# Patient Record
Sex: Male | Born: 1945 | Race: White | Hispanic: No | Marital: Married | Smoking: Never smoker
Health system: Southern US, Community
[De-identification: ages and names within clinical notes are randomized; demographics above are authoritative.]

## PROBLEM LIST (undated history)

## (undated) DIAGNOSIS — N200 Calculus of kidney: Secondary | ICD-10-CM

## (undated) DIAGNOSIS — I10 Essential (primary) hypertension: Secondary | ICD-10-CM

## (undated) DIAGNOSIS — E039 Hypothyroidism, unspecified: Secondary | ICD-10-CM

## (undated) HISTORY — PX: VASECTOMY: SHX75

---

## 2020-01-06 DIAGNOSIS — G250 Essential tremor: Secondary | ICD-10-CM | POA: Diagnosis present

## 2020-08-10 ENCOUNTER — Other Ambulatory Visit: Payer: Self-pay | Admitting: Internal Medicine

## 2020-08-10 ENCOUNTER — Other Ambulatory Visit (HOSPITAL_COMMUNITY): Payer: Self-pay | Admitting: Internal Medicine

## 2020-08-10 DIAGNOSIS — M5441 Lumbago with sciatica, right side: Secondary | ICD-10-CM

## 2020-08-10 DIAGNOSIS — M5442 Lumbago with sciatica, left side: Secondary | ICD-10-CM

## 2020-08-26 ENCOUNTER — Ambulatory Visit
Admission: RE | Admit: 2020-08-26 | Discharge: 2020-08-26 | Disposition: A | Discharge status: Home / Self Care | Payer: Medicare Other | Source: Ambulatory Visit | Attending: Internal Medicine | Admitting: Internal Medicine

## 2020-08-26 ENCOUNTER — Other Ambulatory Visit: Payer: Self-pay

## 2020-08-26 DIAGNOSIS — M5442 Lumbago with sciatica, left side: Secondary | ICD-10-CM | POA: Diagnosis not present

## 2020-08-26 DIAGNOSIS — M5441 Lumbago with sciatica, right side: Secondary | ICD-10-CM | POA: Insufficient documentation

## 2020-08-26 IMAGING — MR MR LUMBAR SPINE W/O CM
4 of 5 series · 26 of 48 positions shown · non-contrast
Comparison: None.

CLINICAL DATA: Low back pain.  Bilateral buttock and leg pain.

EXAM:
MRI LUMBAR SPINE WITHOUT CONTRAST
TECHNIQUE: Multiplanar, multisequence MR imaging of the lumbar spine was
performed. No intravenous contrast was administered.

[Series 2: T2 · sagittal · 4.0mm · 0.81mm/px · 6 of 15 slices shown (1 of 2)]
[im 1/15]
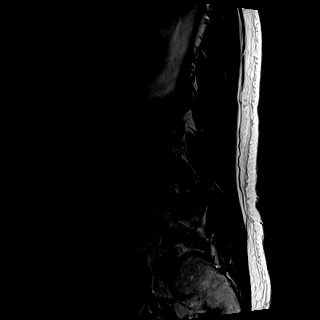
[im 3/15]
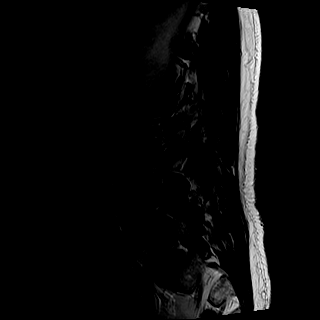
[im 6/15]
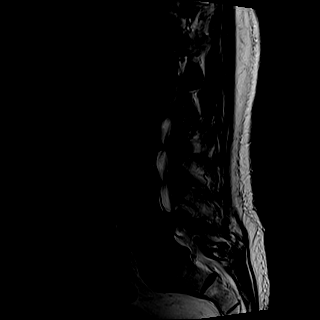
[im 9/15]
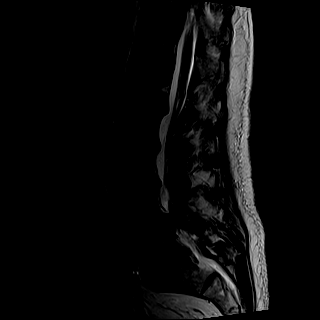
[im 12/15]
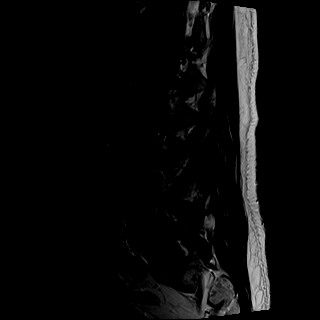
[im 15/15]
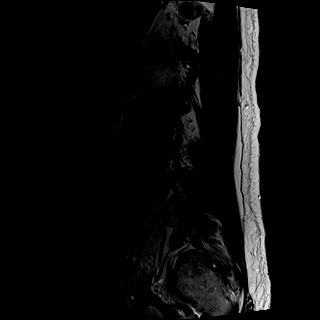

[Series 3: T1 · sagittal · 4.0mm · 0.41mm/px · 6 of 15 slices shown (1 of 2)]
[im 1/15]
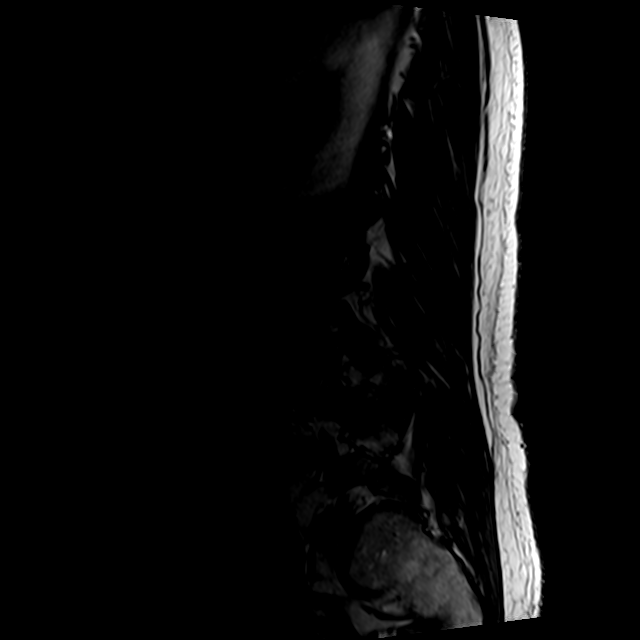
[im 3/15]
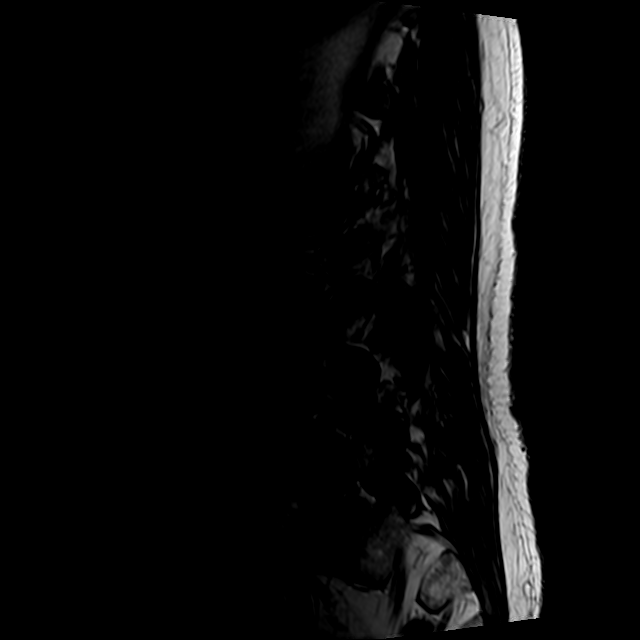
[im 6/15]
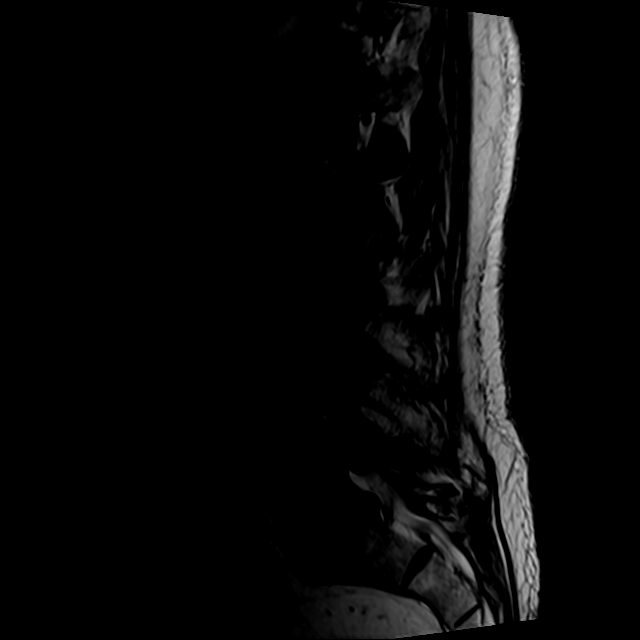
[im 9/15]
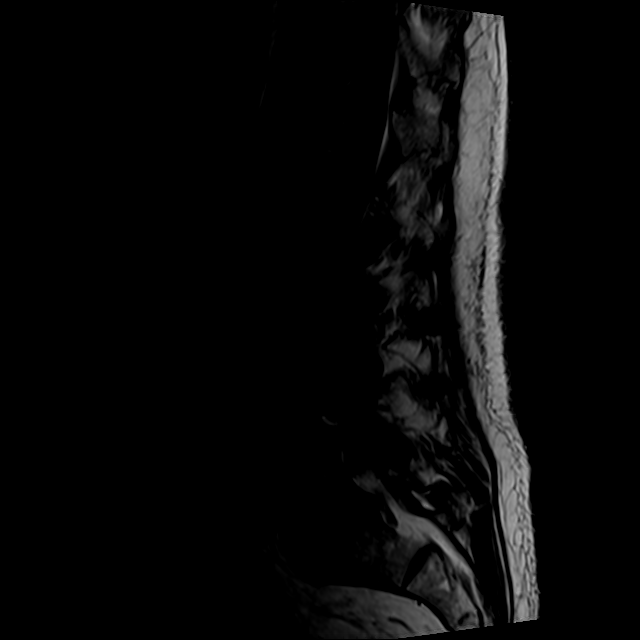
[im 12/15]
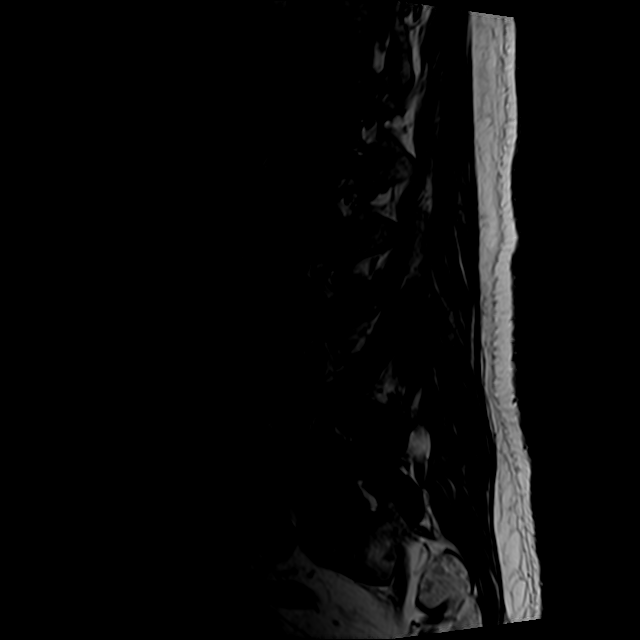
[im 15/15]
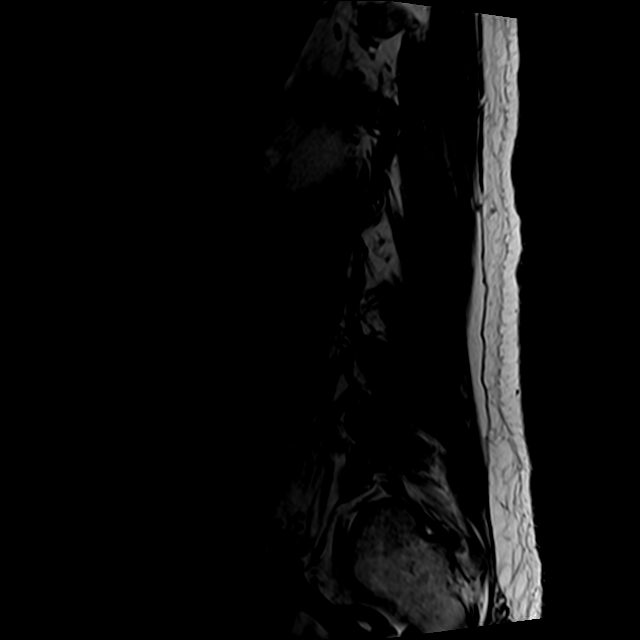

[Series 5: T2 · axial · 4.0mm · 0.78mm/px · z∈[-139,+88]mm · 9 of 39 slices shown (2 of 2)]
[im 1/39]
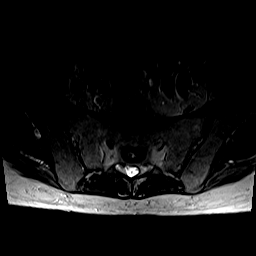
[im 6/39]
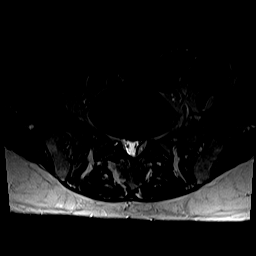
[im 11/39]
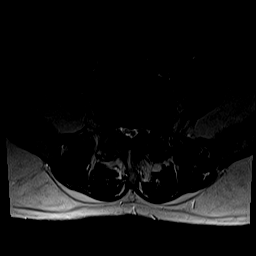
[im 17/39]
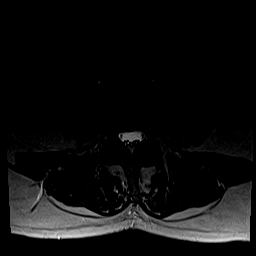
[im 20/39]
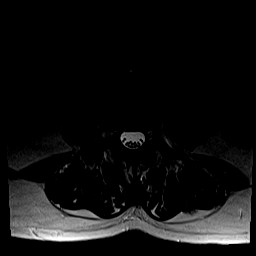
[im 22/39]
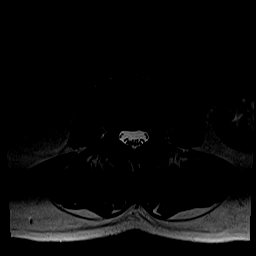
[im 28/39]
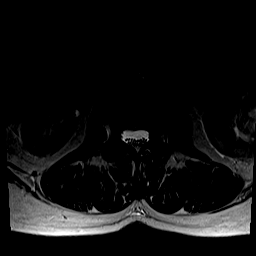
[im 33/39]
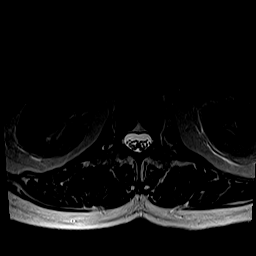
[im 39/39]
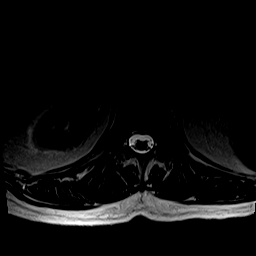

[Series 6: T1 · axial · 4.0mm · 0.39mm/px · z∈[-139,+58]mm · 5 of 39 slices shown (2 of 2)]
[im 1/39]
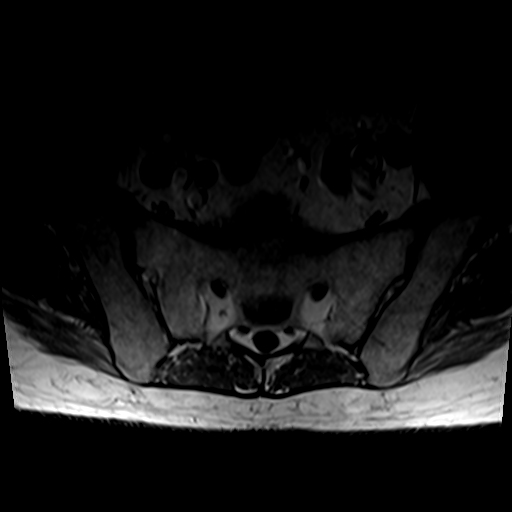
[im 6/39]
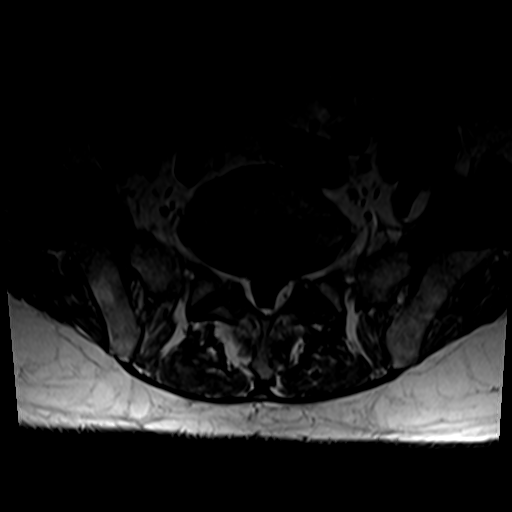
[im 11/39]
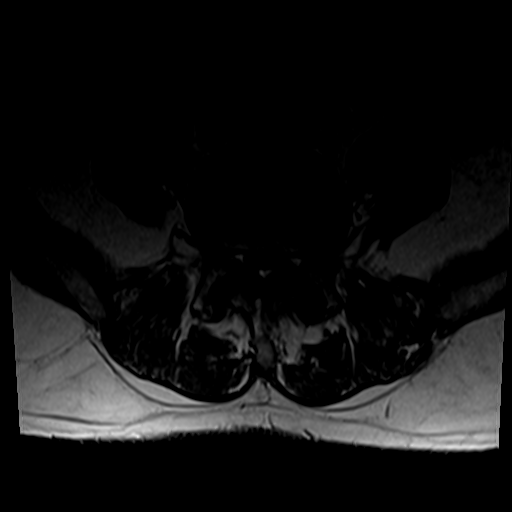
[im 20/39]
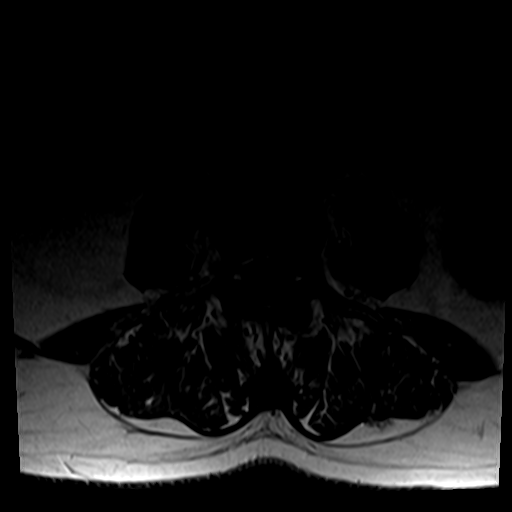
[im 33/39]
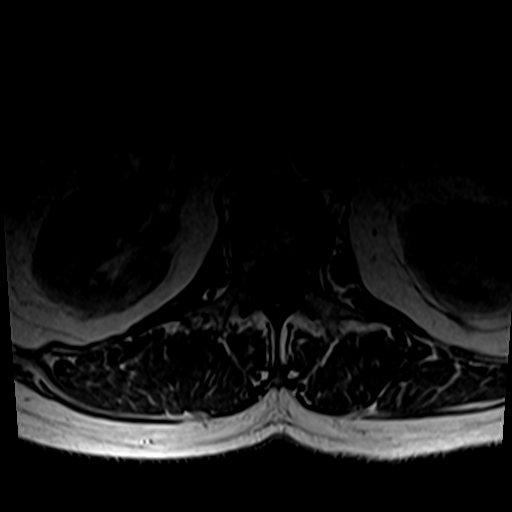

[26 of 48 positions shown; findings below may reference images not displayed]

FINDINGS: Segmentation:  Normal

Alignment: Mild retrolisthesis L1-2 and L2-3. Mild anterolisthesis
L4-5.

Vertebrae:  Normal bone marrow.  Negative for fracture or mass.

Conus medullaris and cauda equina: Conus extends to the L1-2 level.
Conus and cauda equina appear normal.

Paraspinal and other soft tissues: Negative for paraspinous mass or
adenopathy. Abdominal aorta normal in caliber.

Disc levels:

L1-2: Diffuse disc bulging with shallow central disc protrusion.
Mild facet degeneration. Mild spinal stenosis and mild subarticular
stenosis bilaterally.

L2-3: Disc degeneration with disc bulging and diffuse endplate
spurring. Bilateral facet hypertrophy. Mild subarticular stenosis
bilaterally

L3-4: Mild disc bulging and endplate spurring. Mild subarticular
stenosis bilaterally

L4-5: Mild anterolisthesis. Diffuse disc bulging. Severe facet
degeneration bilaterally causing severe spinal stenosis and severe
subarticular stenosis bilaterally

L5-S1: Mild disc and mild facet degeneration. Mild subarticular
stenosis bilaterally.
IMPRESSION: Multilevel degenerative change in the lumbar spine most severe at
L4-5. Severe spinal stenosis at L4-5 with severe subarticular
stenosis bilaterally.

## 2021-03-26 ENCOUNTER — Other Ambulatory Visit: Payer: Self-pay | Admitting: Neurology

## 2021-03-26 DIAGNOSIS — G3184 Mild cognitive impairment, so stated: Secondary | ICD-10-CM

## 2021-04-06 ENCOUNTER — Ambulatory Visit: Payer: Medicare Other

## 2021-04-08 ENCOUNTER — Other Ambulatory Visit: Payer: Self-pay

## 2021-04-08 ENCOUNTER — Ambulatory Visit
Admission: RE | Admit: 2021-04-08 | Discharge: 2021-04-08 | Disposition: A | Discharge status: Home / Self Care | Payer: Medicare Other | Source: Ambulatory Visit | Attending: Neurology | Admitting: Neurology

## 2021-04-08 DIAGNOSIS — G3184 Mild cognitive impairment, so stated: Secondary | ICD-10-CM | POA: Insufficient documentation

## 2021-04-08 IMAGING — MR MR HEAD W/O CM
12 series · 48 of 48 positions shown · non-contrast
Comparison: None.

CLINICAL DATA: Mild cognitive impairment.

EXAM:
MRI HEAD WITHOUT CONTRAST
TECHNIQUE: Multiplanar, multiecho pulse sequences of the brain and surrounding
structures were obtained without intravenous contrast.

[Series 5: ax dwi_tracew · axial · 3.0mm · 0.65mm/px · z∈[-70,+83]mm · 4 of 48 slices shown]
[im 1/48]
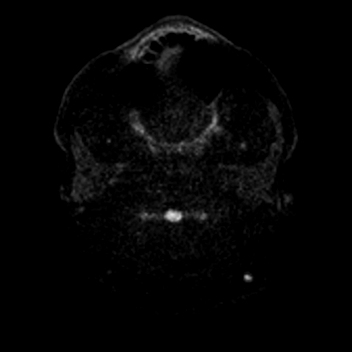
[im 16/48]
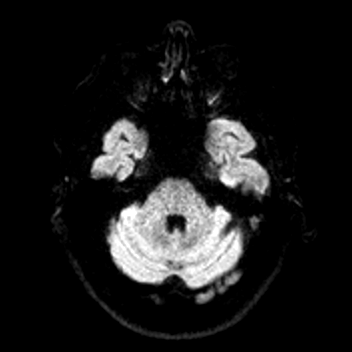
[im 32/48]
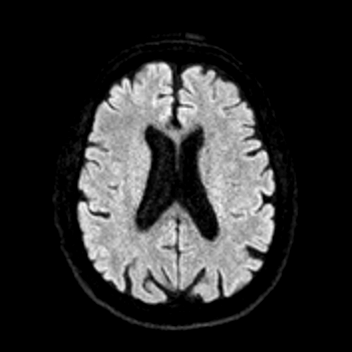
[im 48/48]
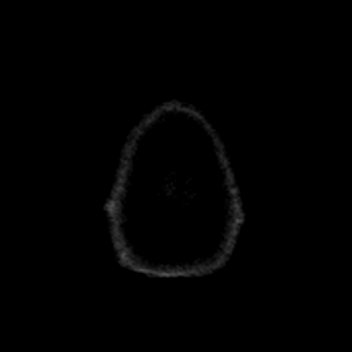

[Series 6: ax dwi_adc · axial · 3.0mm · 0.65mm/px · z∈[-70,+83]mm · 3 of 48 slices shown]
[im 1/48]
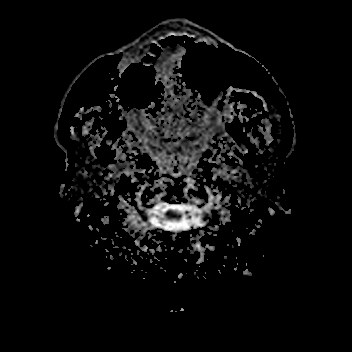
[im 24/48]
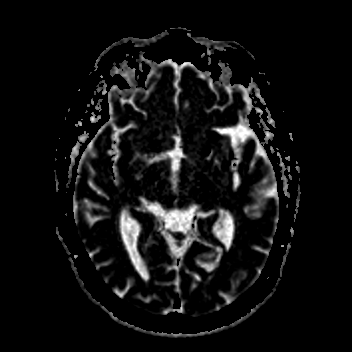
[im 48/48]
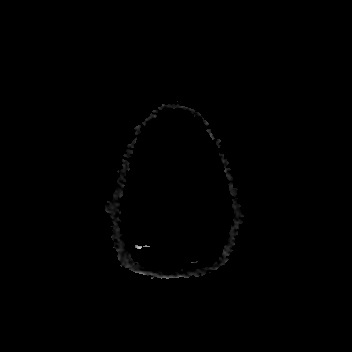

[Series 7: cor dwi_tracew · coronal · 5.0mm · 0.60mm/px · 3 of 38 slices shown]
[im 1/38]
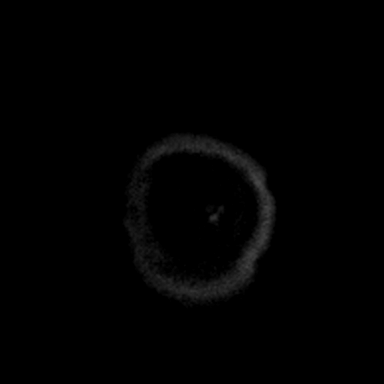
[im 19/38]
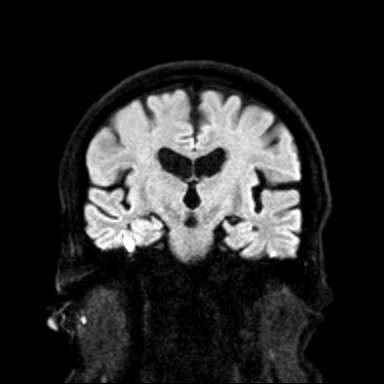
[im 38/38]
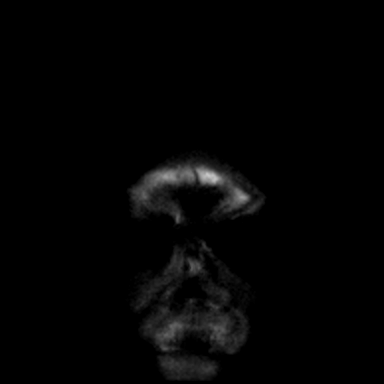

[Series 8: cor dwi_adc · coronal · 5.0mm · 0.60mm/px · 3 of 38 slices shown]
[im 1/38]
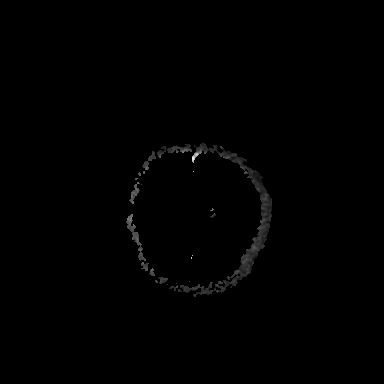
[im 19/38]
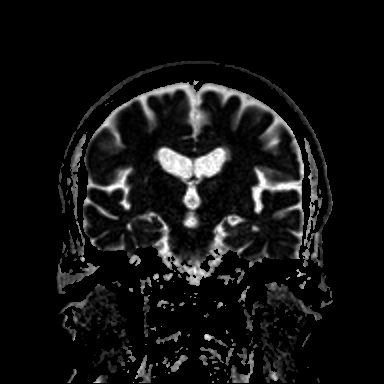
[im 38/38]
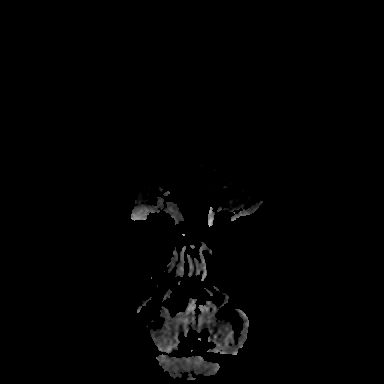

[Series 9: T1 · sagittal · 5.0mm · 0.62mm/px · 2 of 25 slices shown (1 of 2)]
[im 1/25]
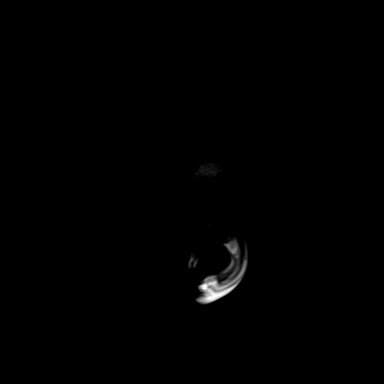
[im 25/25]
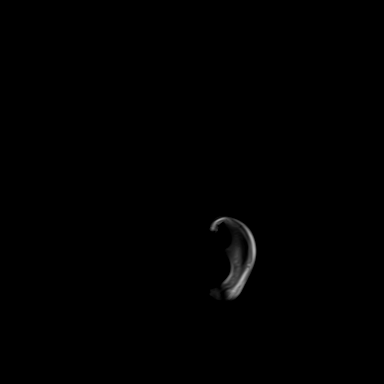

[Series 10: T2 · axial · 5.0mm · 0.53mm/px · z∈[-69,+85]mm · 2 of 27 slices shown (1 of 2)]
[im 1/27]
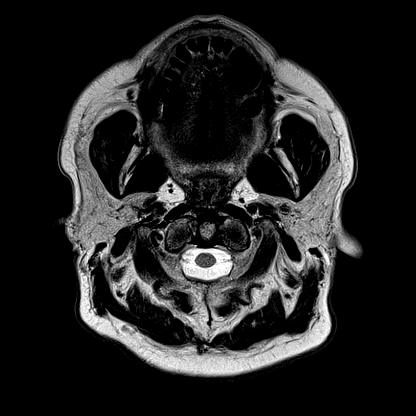
[im 27/27]
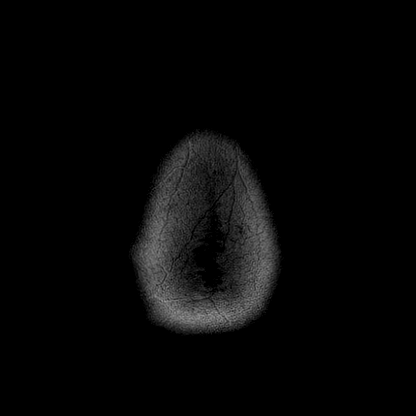

[Series 11: mag_images · axial · 3.0mm · 0.90mm/px · z∈[-79,+96]mm · 4 of 60 slices shown]
[im 1/60]
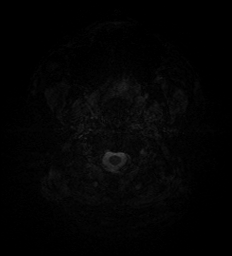
[im 20/60]
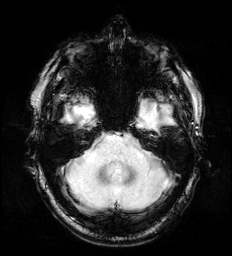
[im 40/60]
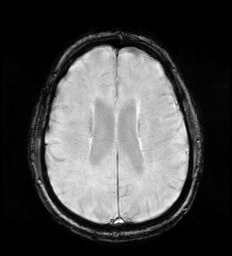
[im 60/60]
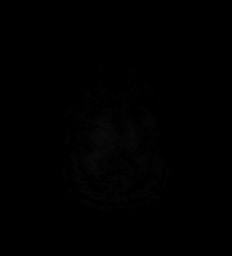

[Series 12: pha_images · axial · 3.0mm · 0.90mm/px · z∈[-79,+96]mm · 4 of 60 slices shown]
[im 1/60]
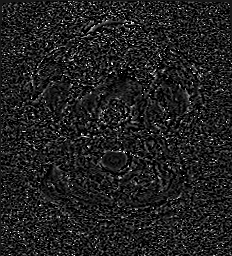
[im 20/60]
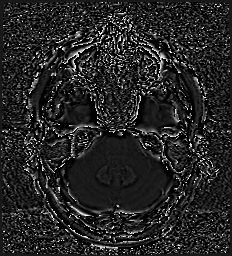
[im 40/60]
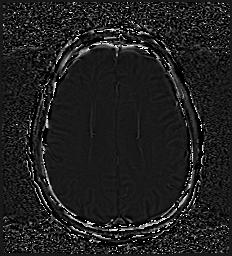
[im 60/60]
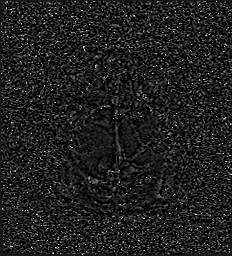

[Series 13: swi_images · axial · 3.0mm · 0.90mm/px · z∈[-79,+96]mm · 4 of 60 slices shown]
[im 1/60]
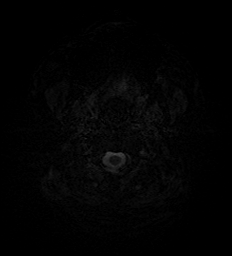
[im 20/60]
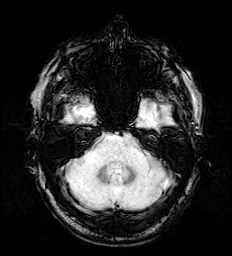
[im 40/60]
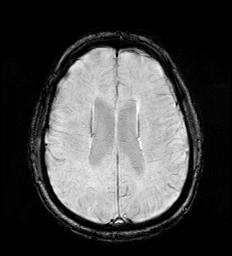
[im 60/60]
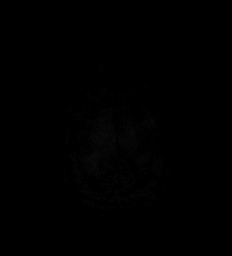

[Series 15: FLAIR · axial · 3.0mm · 0.53mm/px · z∈[-72,+88]mm · 4 of 55 slices shown]
[im 1/55]
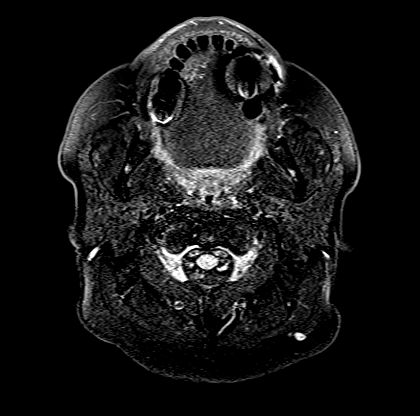
[im 19/55]
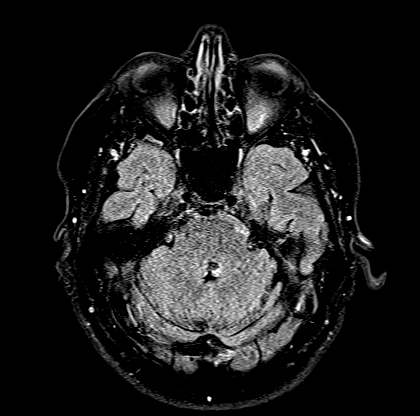
[im 37/55]
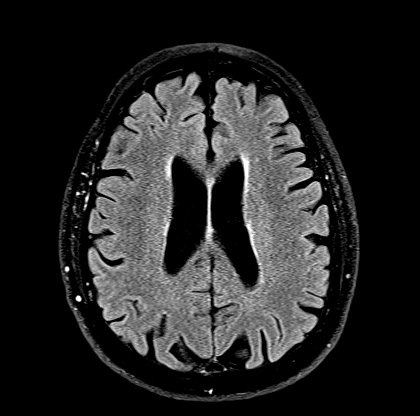
[im 55/55]
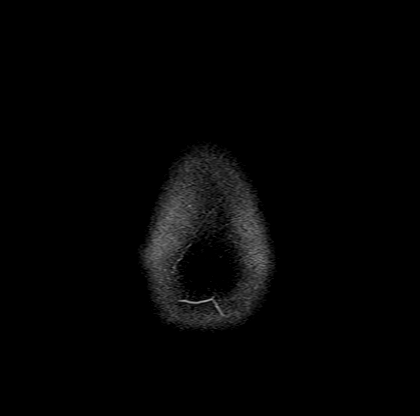

[Series 16: T1 · axial · 1.0mm · 0.98mm/px · z∈[-69,+103]mm · 13 of 175 slices shown (2 of 2)]
[im 1/175]
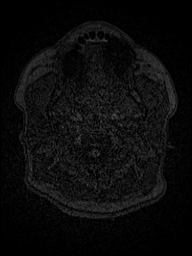
[im 15/175]
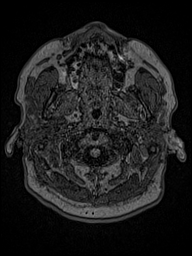
[im 30/175]
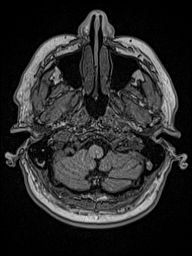
[im 44/175]
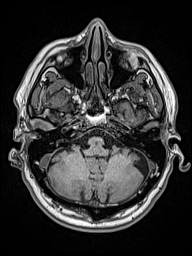
[im 59/175]
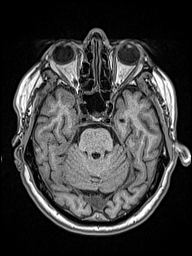
[im 73/175]
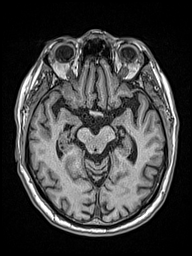
[im 88/175]
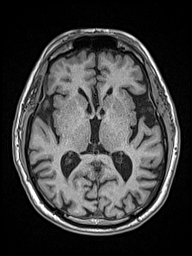
[im 102/175]
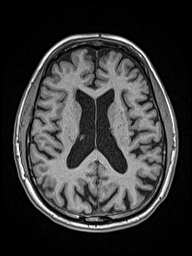
[im 117/175]
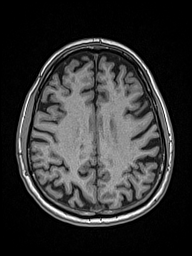
[im 131/175]
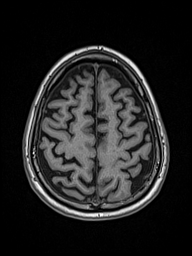
[im 146/175]
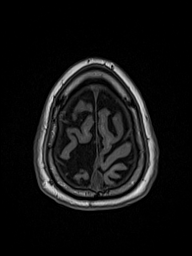
[im 160/175]
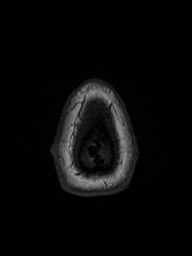
[im 175/175]
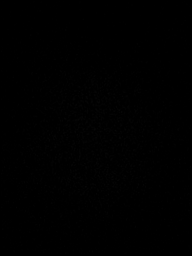

[Series 17: T2 · coronal · 5.0mm · 0.57mm/px · 2 of 31 slices shown (2 of 2)]
[im 1/31]
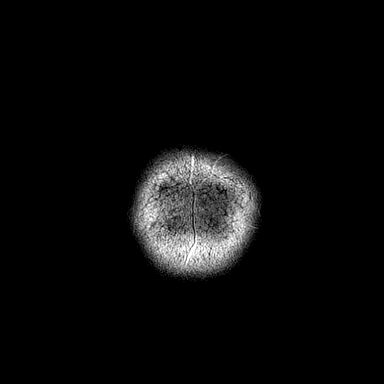
[im 31/31]
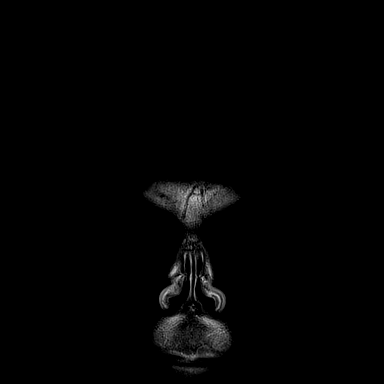

[48 of 48 positions shown; findings below may reference images not displayed]

FINDINGS: Brain: No acute infarction, hemorrhage, or hydrocephalus. Round
prominence of the CSF space in the posterior right frontal convexity
measuring approximately 3.1 x 2.0 cm, may represent a small
arachnoid cyst (series 15, image 50). Mild mass effect on the
adjacent brain parenchyma without edema. Focus of susceptibility
artifact the white matter of the left frontal lobe, suggesting
hemosiderin deposit. Small amount of punctate foci of T2
hyperintensity within the white matter of the cerebral hemispheres,
nonspecific, most likely related to chronic small vessel ischemia.
Mild parenchymal volume loss.

Vascular: Normal flow voids.

Skull and upper cervical spine: Normal marrow signal.

Sinuses/Orbits: Mild mucosal thickening of the right sphenoid sinus.
The orbits are maintained.
IMPRESSION: 1. No acute intracranial abnormality.
2. Mild chronic microvascular ischemic changes of the white matter
and parenchymal volume loss.
3. Possible small right frontal high convexity arachnoid cyst.

## 2021-04-09 ENCOUNTER — Encounter: Payer: Self-pay | Admitting: Counselor

## 2021-06-22 ENCOUNTER — Encounter: Payer: Self-pay | Admitting: Counselor

## 2021-07-29 ENCOUNTER — Ambulatory Visit: Payer: Medicare Other

## 2021-07-29 ENCOUNTER — Other Ambulatory Visit: Payer: Self-pay

## 2021-07-29 ENCOUNTER — Ambulatory Visit (INDEPENDENT_AMBULATORY_CARE_PROVIDER_SITE_OTHER): Payer: Medicare Other | Admitting: Counselor

## 2021-07-29 ENCOUNTER — Encounter: Payer: Self-pay | Admitting: Counselor

## 2021-07-29 DIAGNOSIS — R4189 Other symptoms and signs involving cognitive functions and awareness: Secondary | ICD-10-CM | POA: Diagnosis not present

## 2021-07-29 DIAGNOSIS — R413 Other amnesia: Secondary | ICD-10-CM

## 2021-07-29 DIAGNOSIS — F09 Unspecified mental disorder due to known physiological condition: Secondary | ICD-10-CM

## 2021-07-29 NOTE — Progress Notes (Signed)
NEUROPSYCHOLOGICAL TEST SCORES [facility]  Patient Name: Kyle Chandler MRN: [id number] Date of Birth: 28-Dec-1945 Age: 75 y.o. Education: 20+ years  Measurement properties of test scores: IQ, Index, and Standard Scores (SS): Mean = 100; Standard Deviation = 15 Scaled Scores (Ss): Mean = 10; Standard Deviation = 3 Z scores (Z): Mean = 0; Standard Deviation = 1 T scores (T); Mean = 50; Standard Deviation = 10  TEST SCORES:    Note: This summary of test scores accompanies the interpretive report and should not be interpreted by unqualified individuals or in isolation without reference to the report. Test scores are relative to age, gender, and educational history as available and appropriate.   Performance Validity        "A" Random Letter Test Raw  Descriptor      Errors 0 Within Expectation  The Dot Counting Test: 8 Within Expectation      Embedded Measures: Raw  Descriptor      NAB Effort Index 0 Within Expectation      Expected Functioning        Wide Range Achievement Test: Standard/Scaled Score Percentile      Word Reading 117 87      Reynolds Intellectual Screening Test Standard/T-score Percentile      Guess What 64 92      Odd Item Out 62 88  RIST Index 125 95      Attention/Processing Speed        Neuropsychological Assessment Battery (Attention Module, Form 1): Scaled/T-score Percentile      Digits Forward 50 50      Digits Backwards 45 31      Dots 57 75      Driving Scenes 51 54      Language        Neuropsychological Assessment Battery (Language Module) T Score Percentile      Naming 59 82      Verbal Fluency:  T Score Percentile      Controlled Oral Word Association (F-A-S) 65 93      Semantic Fluency (Animals) 42 21      Memory:        Neuropsychological Assessment Battery (Memory Module, Form 1): T-score/Standard Score Percentile      List Learning           List A Immediate Recall   (6 , 8 , 10) 54 66         List B Immediate Recall    (5) 56 73         List A Short Delayed Recall   (7) 50 50         List A Long Delayed Recall   (8) 56 73         List A Percent Retention   (114 %) --- 66         List A Long Delayed Yes/No Recognition Hits   (11) --- 58         List A Long Delayed Yes/No Recognition False Alarms   (3) --- 73         List A Recognition Discriminability Index --- 73      Story Learning           Immediate Recall   (26, 38) 54 66         Delayed Recall   (36) 60 84         Percent Retention   (95 %) --- 66  Daily Living Memory            Immediate Recall   (25, 22) 63 91          Delayed Recall   (9, 8) 63 91          Percent Retention (100 %) --- 84          Recognition Hits   (9) --- 69      Visuospatial/Constructional Functioning        Neuropsychological Assessment Battery (Visuospatial Module) T-score Percentile      Figure Copy 61 86      Visual Discrimination 51 54      Executive Functioning        Modified Wisconsin Card Sorting Test (MWCST): Standard/T-Score Percentile      Number of Categories Correct 28 2      Number of Perseverative Errors 39 14      Number of Total Errors 31 3      Percent Perseverative Errors 51 54  Executive Function Composite 73 4      Trail Making Test: T-Score Percentile      Part A 39 14      Part B 42 21      Boston Diagnostic Aphasia Exam: Raw Score Scaled Score      Complex Ideational Material 12 12      Rating Scales         Raw Score Descriptor  Patient Health Questionnaire - 9 0 Within Normal Limits  GAD-7 4 Within Normal Limits      Conners' Adult ADHD Rating Scale: Self-Report (Long Version) T-Score Percentile      Inattention/Memory Problems 42 21      Hyperactivity/Restlessness 41 18      Impulsivity/Emotional Lability 47 38      Problems with Self-Concept 44 27      DSM-IV Inattentive Symptoms 35 7      DSM-IV Hyperactive-Impulsive Symptoms 45 31      DSM-IV ADHD Symptoms Total 40 16      ADHD Index 46 34      Quick Dementia  Rating System Raw Score Descriptor      Sum of Boxes 0 Normal      Total Score 1 Normal   Lucero Auzenne V. Roseanne Reno PsyD, ABN Clinical Neuropsychologist

## 2021-07-29 NOTE — Progress Notes (Signed)
   Psychometrist Note   Cognitive testing was administered to [employer] by Lamar Benes, B.S. (Technician) under the supervision of Alphonzo Severance, Psy.D., ABN. Mr. Mincey was able to tolerate all test procedures. Dr. Nicole Kindred met with the patient as needed to manage any emotional reactions to the testing procedures. Rest breaks were offered.    The battery of tests administered was selected by Dr. Nicole Kindred with consideration to the patient's current level of functioning, the nature of his symptoms, emotional and behavioral responses during the interview, level of literacy, observed level of motivation/effort, and the nature of the referral question. This battery was communicated to the psychometrist. Communication between Dr. Nicole Kindred and the psychometrist was ongoing throughout the evaluation and Dr. Nicole Kindred was immediately accessible at all times. Dr. Nicole Kindred provided supervision to the technician on the date of this service, to the extent necessary to assure the quality of all services provided.    Mr. Deese will return in approximately one week for an interactive feedback session with Dr. Nicole Kindred, at which time test performance, clinical impressions, and treatment recommendations will be reviewed in detail. The patient understands he can contact our office should he require our assistance before this time.   A total of 160 minutes of billable time were spent with Blanchard Mane by the technician, including test administration and scoring time. Billing for these services is reflected in Dr. Les Pou note.   This note reflects time spent with the psychometrician and does not include test scores, clinical history, or any interpretations made by Dr. Nicole Kindred. The full report will follow in a separate note.
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
## 2021-08-02 NOTE — Progress Notes (Signed)
NEUROPSYCHOLOGICAL EVALUATION  Neurology  Patient Name: Kyle Chandler MRN: [id number] Date of Birth: Dec 22, 1945 Age: 75 y.o. Education: 20+ years  Clinical Impressions  Kyle Chandler is a 75 y.o., right-hand dominant, married man with a history of HTN, hypothyroidism, and essential tremor for more than 30 years referred by Dr. Sherryll Burger with [facility]. My thanks to Dr. Sherryll Burger for his characteristically detailed notes. On interview, [name] and his wife reported a longstanding history of some "querky-ness," tendencies towards obsessive compulsive deterioriation under stress (mild), and concerns about attention deficit/hyperactivity-disorder. He has more recent subtle changes, such as getting lost in thought more often and gesticulating more than in the past (which was not particularly noticeable during our interactions). He has no real symptoms of concern for dementia in my opinion, as most of the things they reported could be associated with premorbid personality, personal preference, and normal age-related cognitive decline. 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   This is a normal neuropsychological study. Specifically, [name] demonstrated superior premorbid functioning and commensurate levels of performance in essentially every domain assessed. He did have an isolated low score on a card sorting measure associated with executive ability, but then he performed reasonably on several other executive measures. I think he may have been overcomplicating the former task, which can occur in very bright individuals. There are certainly no indications of findings I would tend to associate with Alzheimer's with average to high average scores on every memory measure administered. Performance was also good on verbally and visually mediated measures of  attention. He screened negative for the presence of depression and anxiety on self-rating scales. He also reported low levels of symptoms associated with ADHD on self-rating of symptoms associated with that condition.   Overall impression is that of very high premorbid ability and good cognitive performance. Consideration is given to the possibility of some subtle executive control type problems, although it is not entirely clear if these are premorbid, acquired, or simply reflect testing artifact. Essential tremor has been associated with subtle cognitive changes in some studies and I also wonder about sensitivity to the normal aging process. The possibility of ADHD remains an open question, although at least as per his self-report, he does not have many symptoms. This could be further pursued with a separate evaluation now that clinically evident neurodegeneration has been ruled out.   Diagnostic Impressions: Other symptoms and signs involving cognitive functions and awareness  Recommendations to be discussed with patient  Your performance and presentation on assessment were consistent with very good performance. That is to say, you demonstrated superior levels of overall ability on measures thought to be resistant to acquired impairment and then commensurate levels of test performance in almost every other area. We did have one marginal finding on a card sorting measure associated with executive abilities, but I do not think it is meaningful. You did fine on several other measures of executive abilities and this is a test that can be susceptible to differences in test taking approach. The best diagnosis is thus no diagnosis.  As for what is causing your cognitive changes, I am not sure that there really are cognitive changes in excess of what we would expect for normal aging. Some of the things that you described such as tendencies towards being distracted, getting lost in thought, and the like sound  as  though they may be lifelong tendencies that are now more pronounced. Given that the brain changes throughout senescence, our behavioral tendencies can also change.    One possibility is that you and your wife are sensitive to normal cognitive changes associated with aging. There are things such as processing speed that almost invariably decline as individuals age, which can lead to different patterns of strengths and weakness. Some individuals misinterpret this as a sign of an impending decline or disease process, when in fact it is just part of normal healthy aging. There is also some concern on the part of your wife that you may have attention deficit/hyperactivity disorder. If this is the case, then your brain may age differently than other individuals. I have frequently seen individuals with ADHD develop low-level cognitive symptoms of the type that you describe as they age. I would also note, however, that you scored at a low level for symptoms of inattentiveness and hyperactivity typically associated with ADHD, relative to healthy controls.   If you wanted further input as to whether you have ADHD, that can be pursued through specialty evaluation. I am not sure this really qualifies as a medical issue at this point, and therefore it might not be covered by insurance, although you would have to discuss that with the evaluating clinician. Because you seem to be functioning adequately I am not sure treatment would be indicated, so this would probably be more for your personal knowledge than anything else.   Some cognitive abilities (such as processing speed) naturally decline with age, but there are many things you can do to contribute to healthy cognitive aging. There is evidence from at least one study that a modified low carbohydrate mediterranean diet (the MIND-DASH) diet can contribute to healthy cognitive aging. There is also some evidence to suggest a beneficial effect of coffee (black coffee without  sugar or other additives such as cream) for healthy aging. One glass of red wine a day may also contribute to healthy aging though at two drinks you lose all benefit and at three drinks it may be doing more harm than good. Staying active, mentally and physically, are also crucially important. One of the best ways to do this is simply to stay active and engaged in your life, particularly with social activities. Challenging the mind and other cognitively stimulating activities are encouraged, consider learning a new hobby, reconnecting with old friends, reading an interesting and thought provoking book. It is not so much what you do that is important as it is that you enjoy it and stay at it.   For compensatory strategies, consider the following recommendations: Build routines into your day and stick to them, doing the same thing at the same time helps your body get into a rhythm that makes it harder to forget something you need to do.  Use sticky notes, reminders, a calendar, or your smart phone to provide yourself with reminders, to do lists, and help track appointments.  When you need to do work for school or other things, create an environment that is conducive to that work. This may include putting electronic devices that can be distracting outside the room and working in an area that is quiet and free from distractions.  Break things down into smaller steps to help get started and stop yourself from feeling overwhelmed.  Plan breaks throughout the day where you can get up and move even if it's for just a few minutes at a time.  Focus  your attention on only one thing and avoid multitasking. Although some people are better at it than others, nobody's task performance is as good as it could be when they are alternating attention between two different tasks.  Stay mindful throughout your day and monitor whether you are feeling overwhelmed or disorganized to identify problems before they happen.  Avoid  working under time pressure when you may be more liable to make mistakes.   Test Findings  Test scores are summarized in additional documentation associated with this encounter. Test scores are relative to age, gender, and educational history as available and appropriate. There were no concerns about performance validity as all findings fell within normal expectations.   General Intellectual Functioning/Achievement:  Performance on single word reading was high average. By contrast, his performance on the RIST index was in the superior range. These scores suggest a stringent standard of comparison is appropriate for Dr. Vennie Homans cognitive test performance.  Attention and Processing Efficiency: Indicators of attention were within normal limits. On verbally mediated measures, digit repetition forward was average and digit repetition backward was average. Timed visually mediated indicators involving detecting changes to a series of printed stimulus materials was high average for dot patterns and average for driving scenes. Unfortunately, processing speed could not be well assessed, due to significant tremor.  On the very basic Trail Making Test A, Dr. Earlean Polka scored in the low average range.  Language: Performance on visual object confrontation naming was errorless. Generation of words was superior in response to the letters F-A-S whereas generation of animals in 1 minute was low average.  Visuospatial Function: Performance on visuospatial/constructional indicators was very good, with a high average score on copy of a modestly complex figure. This is when scored liberally, because the line quality was very poor, although that is related to peripheral motor problems from Dr. Vennie Homans tremor.  Performance was average on a measure of fine visual discrimination and attention to detail.  Learning and Memory: Indicators of learning and memory showed good scores, with average or better performance in all  areas. There are no concerns about a memory storage or other memory problems.  [name] learned 6, 8, and 10 words of a 12-item word list across 3 learning trials followed by 7 words on short delayed recall and 8 words at long delayed recall, which are all average scores. Recognition discriminability for the words from the list versus false choices was average.  Memory for a short story was average on immediate recall and high average on delayed recall. Memory for brief, daily-living type information was high average on immediate and delayed recall. Recognition for this information was average.  Executive Functions: Performance on executive measures was good for the most part, although he did have a bit of difficulty on a card sorting measure associated with cognitive flexibility and concept formation. Specifically, the Executive Function Composite of the Modified [employer] fell at an unusually low level. His categories score was extremely low, although his perseverative area score was low average, making me wonder if he did not overthink the task. The manner in which he approached it was not overly rigid. Alternating sequencing of numbers and letters of the alphabet was low average. Generation of words in response to the letters F-A-S was superior. Reasoning with verbal information was errorless, generating an average range score.  Rating Scale(s): Dr. Olin Pia screened negative for the presence of clinically significant levels of anxiety and depression. He completed the CARS, self-report form of  symptoms associated with attention-deficit/hyperactivity disorder. On this measure, he reported low levels of symptoms, with an unusually low level of inattentive symptoms. The scores are not consistent with the hypothesis that he has ADHD. I would also note that there were not many features in his clinical history that I tend to associate with ADHD, such as difficulties with  attention/impulsivity as a child, career underachievement, problems in school, behavior dysregulation, substance use and the like. He does have a family history which can be a risk factor.  Bettye Boeck Roseanne Reno, PsyD, ABN Clinical Neuropsychologist  Coding and Compliance  Billing below reflects technician time, my direct face-to-face time with the patient, time spent in test administration, and time spent in professional activities including but not limited to: neuropsychological test interpretation, integration of neuropsychological test data with clinical history, report preparation, treatment planning, care coordination, and review of diagnostically pertinent medical history or studies.   Services associated with this encounter: Clinical Interview [phone]) plus 200 minutes ([id number]; Neuropsychological Evaluation by Professional)  160 minutes ([id number]; Neuropsychological Testing by Technician)

## 2021-08-03 ENCOUNTER — Other Ambulatory Visit: Payer: Self-pay

## 2021-08-03 ENCOUNTER — Ambulatory Visit (INDEPENDENT_AMBULATORY_CARE_PROVIDER_SITE_OTHER): Payer: Medicare Other | Admitting: Counselor

## 2021-08-03 DIAGNOSIS — F09 Unspecified mental disorder due to known physiological condition: Secondary | ICD-10-CM

## 2021-08-03 NOTE — Progress Notes (Signed)
   NEUROPSYCHOLOGY FEEDBACK NOTE [facility]  Feedback Note: I met with Kyle Chandler to review the findings resulting from his neuropsychological evaluation. He was accompanied by [name]. Since the last appointment, he has been the same. Time was spent reviewing the impressions and recommendations that are detailed in the evaluation report. We discussed impression of normal cognitive performance, without any concern for decline in any area, as reflected in the patient instructions. I took time to explain the findings and answer all the patient's questions. I encouraged Kyle Chandler to contact me should he have any further questions or if further follow up is desired.   Current Medications and Medical History   No current outpatient medications on file.   No current facility-administered medications for this visit.    There are no problems to display for this patient.   Mental Status and Behavioral Observations  Kyle Chandler presented on time to the present encounter and was alert and generally oriented. Speech was normal in rate, rhythm, volume, and prosody. Self-reported mood was "good" and affect was euthymic. Thought process was logical and goal oriented and thought content was appropriate to the topics discussed. There were no safety concerns identified at today's encounter, such as thoughts of harming self or others.   Plan  Feedback provided regarding the patient's neuropsychological evaluation. He is doing very well from a cognitive perspective. Kyle Chandler was encouraged to contact me if any questions arise or if further follow up is desired.   Kyle Chandler Nicole Kindred, PsyD, ABN Clinical Neuropsychologist  Service(s) Provided at This Encounter: 32 minutes [phone]; Conjoint therapy with patient present)

## 2021-08-03 NOTE — Patient Instructions (Signed)
Your performance and presentation on assessment were consistent with very good performance. That is to say, you demonstrated superior levels of overall ability on measures thought to be resistant to acquired impairment and then commensurate levels of test performance in almost every other area. We did have one marginal finding on a card sorting measure associated with executive abilities, but I do not think it is meaningful. You did fine on several other measures of executive abilities and this is a test that can be susceptible to differences in test taking approach. The best diagnosis is thus no diagnosis.  As for what is causing your cognitive changes, I am not sure that there really are cognitive changes in excess of what we would expect for normal aging. Some of the things that you described such as tendencies towards being distracted, getting lost in thought, and the like sound as though they may be lifelong tendencies that are now more pronounced. Given that the brain changes throughout senescence, our behavioral tendencies can also change.     One possibility is that you and your wife are sensitive to normal cognitive changes associated with aging. There are things such as processing speed that almost invariably decline as individuals age, which can lead to different patterns of strengths and weakness. Some individuals misinterpret this as a sign of an impending decline or disease process, when in fact it is just part of normal healthy aging. There is also some concern on the part of your wife that you may have attention deficit/hyperactivity disorder. If this is the case, then your brain may age differently than other individuals. I have frequently seen individuals with ADHD develop low-level cognitive symptoms of the type that you describe as they age. I would also note, however, that you scored at a low level for symptoms of inattentiveness and hyperactivity typically associated with ADHD, relative to  healthy controls.   If you wanted further input as to whether you have ADHD, that can be pursued through specialty evaluation. I am not sure this really qualifies as a medical issue at this point, and therefore it might not be covered by insurance, although you would have to discuss that with the evaluating clinician. Because you seem to be functioning adequately I am not sure treatment would be indicated, so this would probably be more for your personal knowledge than anything else.    Some cognitive abilities (such as processing speed) naturally decline with age, but there are many things you can do to contribute to healthy cognitive aging. There is evidence from at least one study that a modified low carbohydrate mediterranean diet (the MIND-DASH) diet can contribute to healthy cognitive aging. There is also some evidence to suggest a beneficial effect of coffee (black coffee without sugar or other additives such as cream) for healthy aging. One glass of red wine a day may also contribute to healthy aging though at two drinks you lose all benefit and at three drinks it may be doing more harm than good. Staying active, mentally and physically, are also crucially important. One of the best ways to do this is simply to stay active and engaged in your life, particularly with social activities. Challenging the mind and other cognitively stimulating activities are encouraged, consider learning a new hobby, reconnecting with old friends, reading an interesting and thought provoking book. It is not so much what you do that is important as it is that you enjoy it and stay at it.    For compensatory strategies,  consider the following recommendations: Build routines into your day and stick to them, doing the same thing at the same time helps your body get into a rhythm that makes it harder to forget something you need to do.  Use sticky notes, reminders, a calendar, or your smart phone to provide yourself with  reminders, to do lists, and help track appointments.  When you need to do work for school or other things, create an environment that is conducive to that work. This may include putting electronic devices that can be distracting outside the room and working in an area that is quiet and free from distractions.  Break things down into smaller steps to help get started and stop yourself from feeling overwhelmed.  Plan breaks throughout the day where you can get up and move even if it's for just a few minutes at a time.  Focus your attention on only one thing and avoid multitasking. Although some people are better at it than others, nobody's task performance is as good as it could be when they are alternating attention between two different tasks.  Stay mindful throughout your day and monitor whether you are feeling overwhelmed or disorganized to identify problems before they happen.  Avoid working under time pressure when you may be more liable to make mistakes.

## 2021-08-16 ENCOUNTER — Encounter: Payer: Medicare Other | Admitting: Counselor

## 2021-08-24 ENCOUNTER — Encounter: Payer: Medicare Other | Admitting: Counselor

## 2021-11-28 ENCOUNTER — Encounter: Payer: Self-pay | Admitting: Emergency Medicine

## 2021-11-28 ENCOUNTER — Observation Stay
Admission: EM | Admit: 2021-11-28 | Discharge: 2021-11-29 | Disposition: A | Discharge status: Home / Self Care | Payer: Medicare Other | Attending: Hospitalist | Admitting: Hospitalist

## 2021-11-28 ENCOUNTER — Other Ambulatory Visit: Payer: Self-pay

## 2021-11-28 ENCOUNTER — Emergency Department: Payer: Medicare Other

## 2021-11-28 DIAGNOSIS — G250 Essential tremor: Secondary | ICD-10-CM | POA: Diagnosis present

## 2021-11-28 DIAGNOSIS — E039 Hypothyroidism, unspecified: Secondary | ICD-10-CM

## 2021-11-28 DIAGNOSIS — I10 Essential (primary) hypertension: Secondary | ICD-10-CM | POA: Insufficient documentation

## 2021-11-28 DIAGNOSIS — I160 Hypertensive urgency: Principal | ICD-10-CM

## 2021-11-28 DIAGNOSIS — D696 Thrombocytopenia, unspecified: Secondary | ICD-10-CM | POA: Diagnosis not present

## 2021-11-28 DIAGNOSIS — E871 Hypo-osmolality and hyponatremia: Secondary | ICD-10-CM | POA: Diagnosis not present

## 2021-11-28 DIAGNOSIS — Z8679 Personal history of other diseases of the circulatory system: Secondary | ICD-10-CM

## 2021-11-28 DIAGNOSIS — Z87442 Personal history of urinary calculi: Secondary | ICD-10-CM

## 2021-11-28 DIAGNOSIS — G3184 Mild cognitive impairment, so stated: Secondary | ICD-10-CM

## 2021-11-28 DIAGNOSIS — R519 Headache, unspecified: Secondary | ICD-10-CM | POA: Diagnosis present

## 2021-11-28 DIAGNOSIS — Z20822 Contact with and (suspected) exposure to covid-19: Secondary | ICD-10-CM | POA: Insufficient documentation

## 2021-11-28 HISTORY — DX: Calculus of kidney: N20.0

## 2021-11-28 HISTORY — DX: Essential (primary) hypertension: I10

## 2021-11-28 HISTORY — DX: Hypothyroidism, unspecified: E03.9

## 2021-11-28 LAB — CBC WITH DIFFERENTIAL/PLATELET
Abs Immature Granulocytes: 0.03 10*3/uL (ref 0.00–0.07)
Basophils Absolute: 0 10*3/uL (ref 0.0–0.1)
Basophils Relative: 0 %
Eosinophils Absolute: 0 10*3/uL (ref 0.0–0.5)
Eosinophils Relative: 0 %
HCT: 40.5 % (ref 39.0–52.0)
Hemoglobin: 13.8 g/dL (ref 13.0–17.0)
Immature Granulocytes: 1 %
Lymphocytes Relative: 41 %
Lymphs Abs: 2.3 10*3/uL (ref 0.7–4.0)
MCH: 29.6 pg (ref 26.0–34.0)
MCHC: 34.1 g/dL (ref 30.0–36.0)
MCV: 86.9 fL (ref 80.0–100.0)
Monocytes Absolute: 0.9 10*3/uL (ref 0.1–1.0)
Monocytes Relative: 16 %
Neutro Abs: 2.4 10*3/uL (ref 1.7–7.7)
Neutrophils Relative %: 42 %
Platelets: 32 10*3/uL — ABNORMAL LOW (ref 150–400)
RBC: 4.66 MIL/uL (ref 4.22–5.81)
RDW: 14 % (ref 11.5–15.5)
Smear Review: DECREASED
WBC: 5.7 10*3/uL (ref 4.0–10.5)
nRBC: 0 % (ref 0.0–0.2)

## 2021-11-28 LAB — BASIC METABOLIC PANEL
Anion gap: 12 (ref 5–15)
BUN: 16 mg/dL (ref 8–23)
CO2: 23 mmol/L (ref 22–32)
Calcium: 9.5 mg/dL (ref 8.9–10.3)
Chloride: 95 mmol/L — ABNORMAL LOW (ref 98–111)
Creatinine, Ser: 0.78 mg/dL (ref 0.61–1.24)
GFR, Estimated: >60 mL/min (ref >60)
Glucose, Bld: 96 mg/dL (ref 70–99)
Potassium: 3.9 mmol/L (ref 3.5–5.1)
Sodium: 130 mmol/L — ABNORMAL LOW (ref 135–145)

## 2021-11-28 LAB — RESP PANEL BY RT-PCR (FLU A&B, COVID) ARPGX2
Influenza A by PCR: NEGATIVE
Influenza B by PCR: NEGATIVE
SARS Coronavirus 2 by RT PCR: NEGATIVE

## 2021-11-28 IMAGING — CT CT HEAD W/O CM
4 series · 16 of 47 positions shown, 18 images · non-contrast
Comparison: MRI [DATE]

CLINICAL DATA: Headache, new or worsening.  Hypertension.



[Series 2: head wo · axial · 0.43mm/px · z∈[+164,+279]mm · 7 of 31 slices shown, 9 images]
[im 4/31  brain]
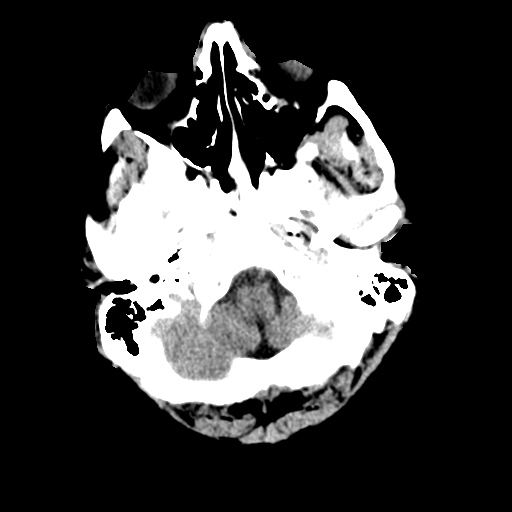
[im 4/31  bone]
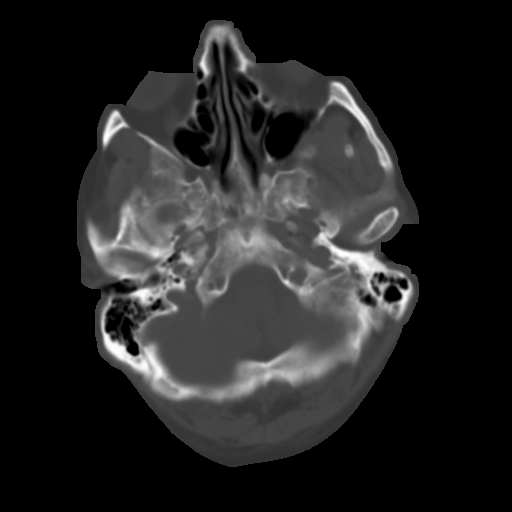
[im 8/31  brain]
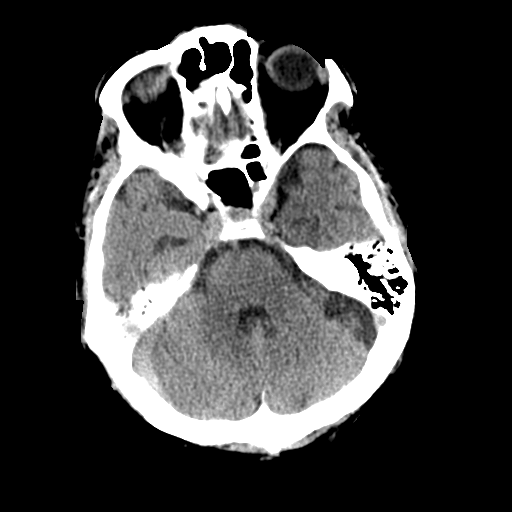
[im 12/31  brain]
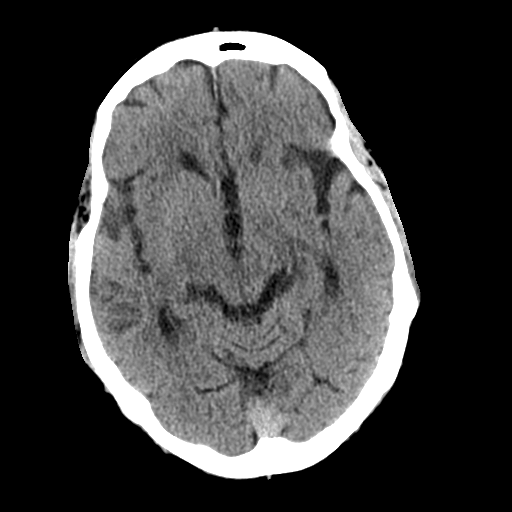
[im 16/31  brain]
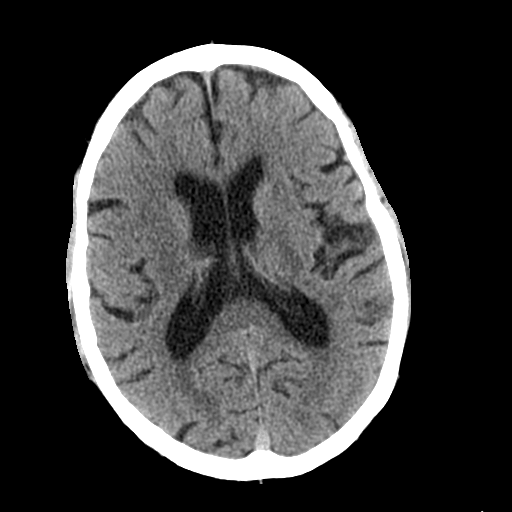
[im 19/31  brain]
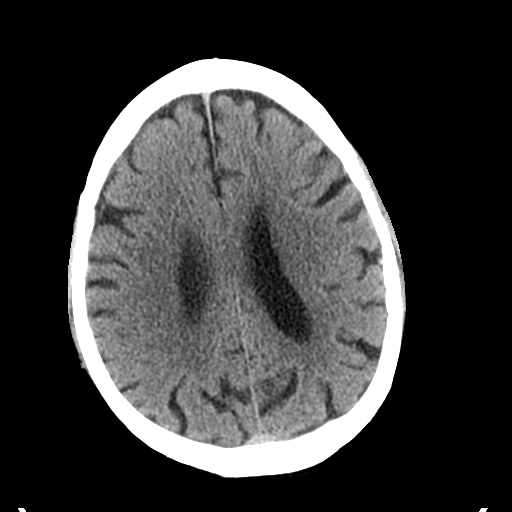
[im 19/31  bone]
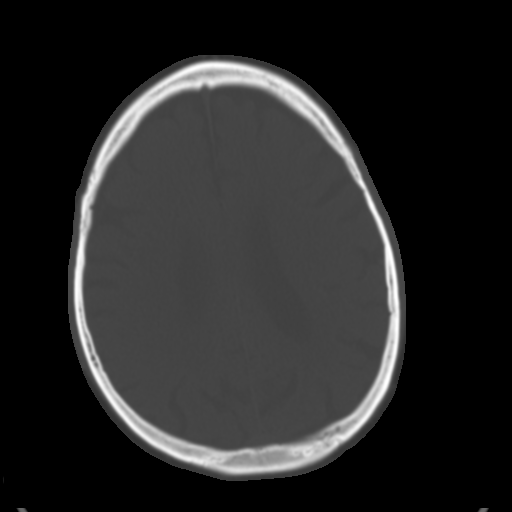
[im 23/31  brain]
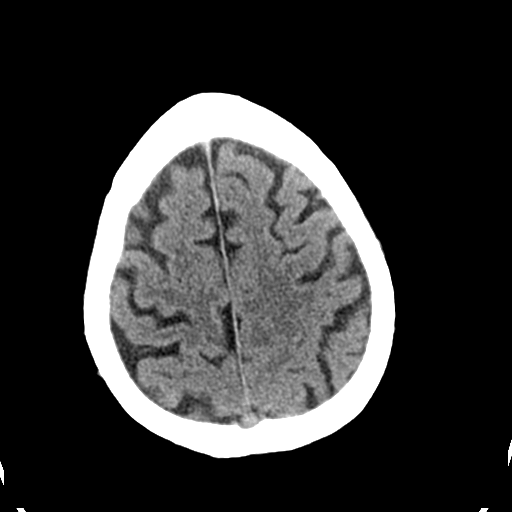
[im 27/31  brain]
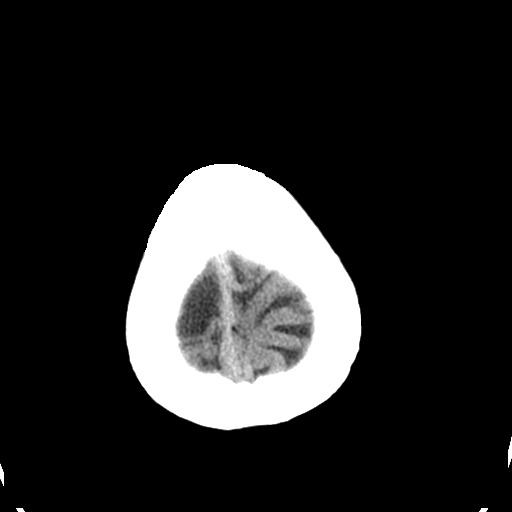

[Series 3: head bone · axial · 0.43mm/px · z∈[+163,+195]mm · 3 of 78 slices shown]
[im 8/78  bone]
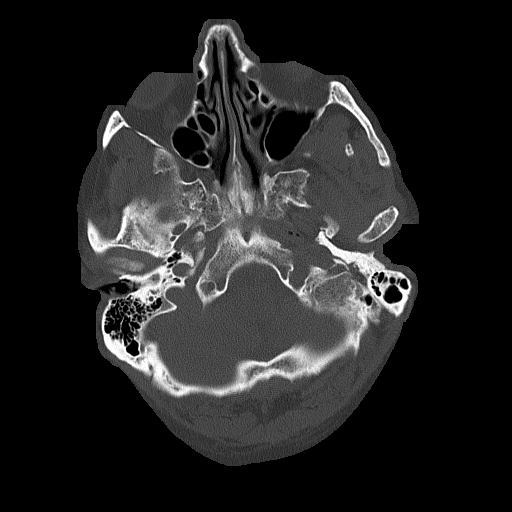
[im 16/78  bone]
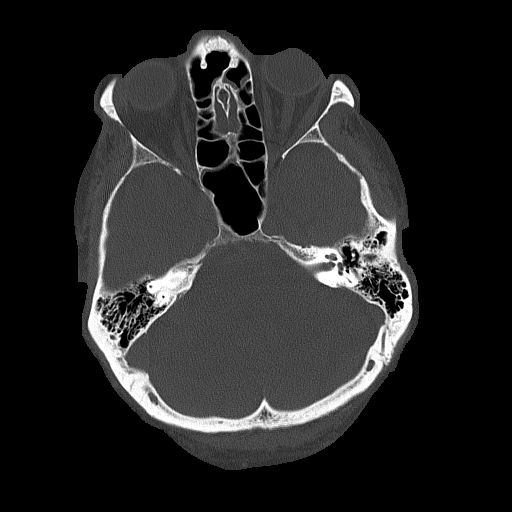
[im 24/78  bone]
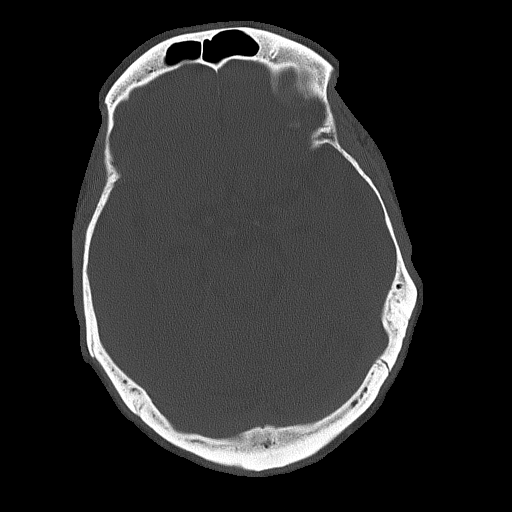

[Series 4: coronal soft tissue · coronal · 0.31mm/px · 3 of 67 slices shown]
[im 23/67  brain]
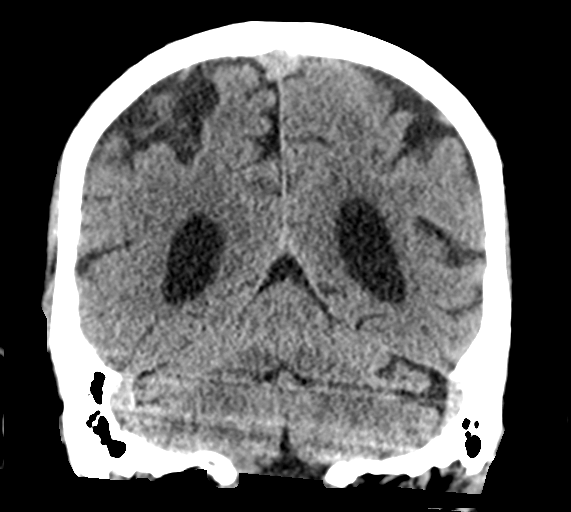
[im 30/67  brain]
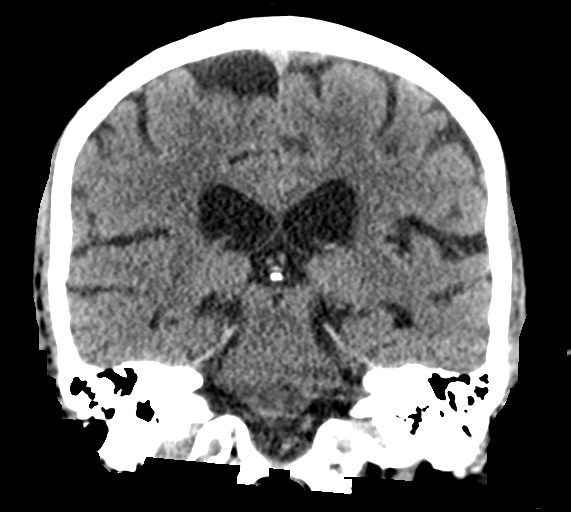
[im 37/67  brain]
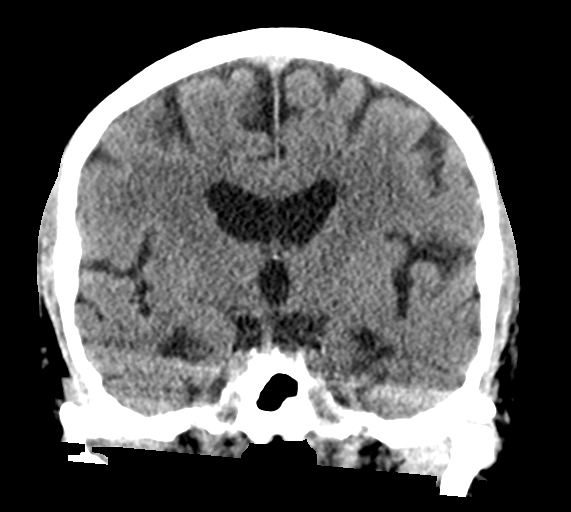

[Series 5: sagittal soft tissue · sagittal · 0.31mm/px · 3 of 53 slices shown]
[im 18/53  brain]
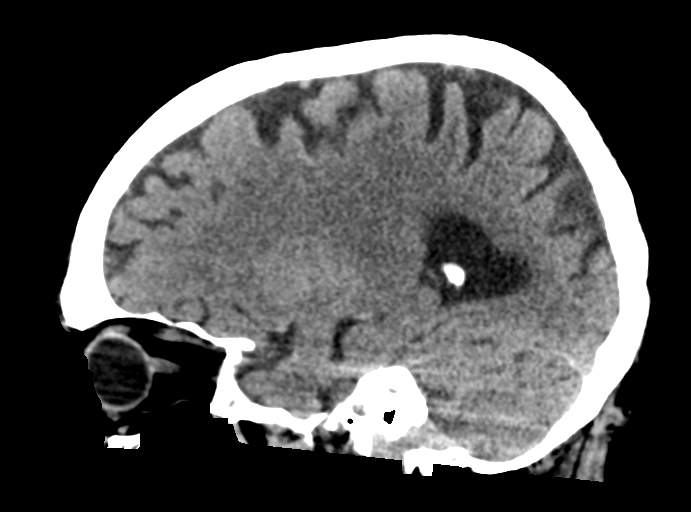
[im 27/53  brain]
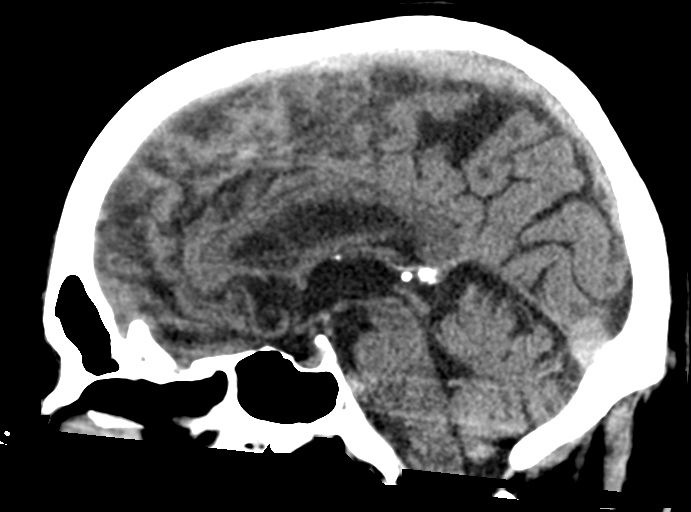
[im 35/53  brain]
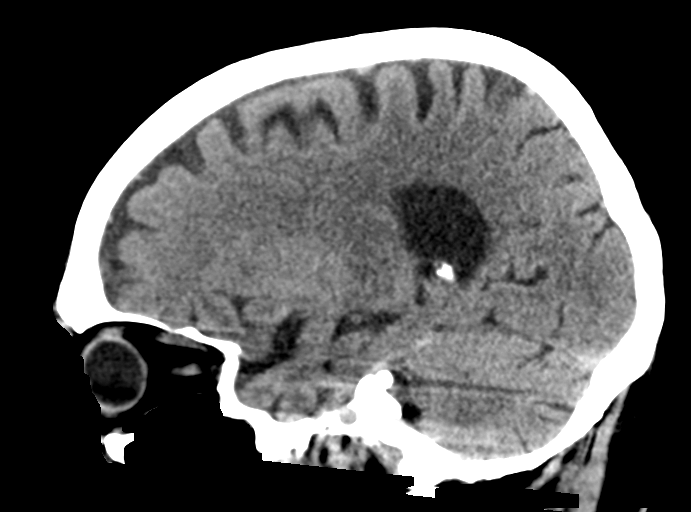

[16 of 47 positions shown; findings below may reference images not displayed]

FINDINGS: Brain: The brain shows a normal appearance without evidence of
malformation, atrophy, old or acute small or large vessel
infarction, mass lesion, hemorrhage, hydrocephalus or extra-axial
collection.

Vascular: No hyperdense vessel. No evidence of atherosclerotic
calcification.

Skull: Normal.  No traumatic finding.  No focal bone lesion.

Sinuses/Orbits: Sinuses are clear. Orbits appear normal. Mastoids
are clear.

Other: None significant
IMPRESSION: Normal head CT.

## 2021-11-28 MED ORDER — ACETAMINOPHEN 650 MG RE SUPP: 650.0000 mg | Freq: Four times a day (QID) | RECTAL | Status: DC | PRN

## 2021-11-28 MED ORDER — ACETAMINOPHEN 325 MG PO TABS
650.0000 mg | ORAL_TABLET | Freq: Four times a day (QID) | ORAL | Status: DC | PRN
  Administered 2021-11-28: 650 mg via ORAL
  Filled 2021-11-28: qty 2

## 2021-11-28 MED ORDER — PROPRANOLOL HCL ER 80 MG PO CP24
80.0000 mg | ORAL_CAPSULE | Freq: Every day | ORAL | Status: DC
  Administered 2021-11-28: 80 mg via ORAL
  Filled 2021-11-28 (×2): qty 1

## 2021-11-28 MED ORDER — HYDRALAZINE HCL 20 MG/ML IJ SOLN
10.0000 mg | Freq: Once | INTRAMUSCULAR | Status: AC
  Administered 2021-11-28: 10 mg via INTRAVENOUS
  Filled 2021-11-28: qty 1

## 2021-11-28 MED ORDER — AMLODIPINE BESYLATE 5 MG PO TABS
5.0000 mg | ORAL_TABLET | Freq: Every day | ORAL | Status: DC
  Administered 2021-11-28: 5 mg via ORAL
  Filled 2021-11-28: qty 1

## 2021-11-28 MED ORDER — ONDANSETRON HCL 4 MG PO TABS: 4.0000 mg | ORAL_TABLET | Freq: Four times a day (QID) | ORAL | Status: DC | PRN

## 2021-11-28 MED ORDER — HYDRALAZINE HCL 20 MG/ML IJ SOLN
5.0000 mg | Freq: Once | INTRAMUSCULAR | Status: AC
  Administered 2021-11-28: 5 mg via INTRAVENOUS
  Filled 2021-11-28: qty 1

## 2021-11-28 MED ORDER — ONDANSETRON HCL 4 MG/2ML IJ SOLN: 4.0000 mg | Freq: Four times a day (QID) | INTRAMUSCULAR | Status: DC | PRN

## 2021-11-28 NOTE — ED Notes (Signed)
Patient returned from CT

## 2021-11-28 NOTE — H&P (Signed)
History and Physical    Patient: Kyle Chandler [id number] DOB: 19-Dec-1945 DOA: 11/28/2021 DOS: the patient was seen and examined on 11/28/2021 PCP: Idelle Crouch, MD  Patient coming from: Home  Chief Complaint:  Chief Complaint  Patient presents with   Hypertension    HPI: Kyle Chandler is a 76 y.o. male with medical history significant for hypertension, hypothyroidism, mild cognitive impairment and essential tremor, who presents to the ED with a complaint of intermittent headache to the back of his head.  He denies aggravating or alleviating factors but states that his blood pressure has been running very high over the last several days with systolics in the [id number] and appears to continue to increase and spikes of PCP-guided increase in his valsartan dose over the course of the last 3 weeks.  He denies associated visual disturbance, one-sided weakness numbness or tingling in the face or extremities.  He denies chest pain or shortness of breath and denies confusion or lightheadedness.  Patient states couple years prior he had an episode of severe hypotension with a near passing out episode while on a trip and had been maintained on low doses of antihypertensives since with good control until 3 weeks ago.   ED course: On arrival BP 240/106 with pulse of 57 and otherwise unremarkable vitals Blood work: CBC and BMP significant for platelet count of 32,000, down from baseline 80,000 (09/2021 on Care everywhere) and sodium of 130  EKG, personally viewed and interpreted: Sinus bradycardia at 58 with no acute ST-T wave changes CT head normal.  Patient treated with IV hydralazine 10 mg followed by 5 mg with improvement in BP to 197/103 by admission.  Hospitalist consulted for admission for blood pressure control in the setting of acute on chronic thrombocytopenia  Data Reviewed: Notes from primary care and specialist visits, past discharge summaries as applicable as available in EHR, including  Care Everywhere. Prior diagnostic testing as applicable to current admission diagnoses Updated medications and problem lists for reconciliation ED course, including vitals, labs, imaging, treatment and response to treatment Triage notes and ED providers notes Notable results as noted in HPI   Review of Systems: As mentioned in the history of present illness. All other systems reviewed and are negative. Past Medical History:  Diagnosis Date   Hypertension     Social History:  has no history on file for tobacco use, alcohol use, and drug use.  Allergies  Allergen Reactions   Meperidine Anaphylaxis   Other Anaphylaxis    IVP dye   Meperidine Hcl    Amoxicillin Other (See Comments) and Rash    Delayed sensitivity     No family history on file.  Prior to Admission medications   Not on File    Physical Exam: Vitals:   11/28/21 2020 11/28/21 2030 11/28/21 2045 11/28/21 2100  BP: (!) 194/90 (!) 173/78 (!) 172/83 (S) (!) 195/103  Pulse: 63 62 66 64  Resp: (!) 22 11 14 15   Temp:      TempSrc:      SpO2: 98% 96% 95% 96%  Weight:      Height:       Physical Exam Vitals and nursing note reviewed.  Constitutional:      General: He is not in acute distress.    Appearance: Normal appearance.  HENT:     Head: Normocephalic and atraumatic.  Cardiovascular:     Rate and Rhythm: Normal rate and regular rhythm.     Pulses: Normal pulses.  Heart sounds: Normal heart sounds. No murmur heard. Pulmonary:     Effort: Pulmonary effort is normal.     Breath sounds: Normal breath sounds. No wheezing or rhonchi.  Abdominal:     General: Bowel sounds are normal.     Palpations: Abdomen is soft.     Tenderness: There is no abdominal tenderness.  Musculoskeletal:        General: No swelling or tenderness. Normal range of motion.     Cervical back: Normal range of motion and neck supple.  Skin:    General: Skin is warm and dry.  Neurological:     General: No focal deficit  present.     Mental Status: He is alert. Mental status is at baseline.  Psychiatric:        Mood and Affect: Mood normal.        Behavior: Behavior normal.     Assessment and Plan: * Hypertensive urgency- (present on admission) Continue home valsartan 160 mg daily We will add amlodipine 5 mg daily, first dose now Hold off on indapamide due to hyponatremia Continue propranolol for essential tremor Monitor for hypotension in view of past history of a hypotensive event leading to a near syncopal episode  Thrombocytopenia (HCC) Platelet count of 32,000, down from baseline 80,000 (09/2021 on Care everywhere) SCD for DVT prophylaxis No reports of bleeding Continue to trend Consider hematology consult versus outpatient referral   Hyponatremia Sodium of 130, likely secondary to thiazide diuretic indapamide Will hold indapamide for now and monitor serum sodium  Mild cognitive impairment Delirium precautions  History of hypotension History of hypotensive episode now with severe hypertension We will monitor orthostatics Consider cardiology referral at discharge  History of kidney stones Patient states he takes indapamide for his history of kidney stones and will rather not have it discontinued  Hypothyroidism Continue levothyroxine  Essential tremor- (present on admission) Continue propranolol       Advance Care Planning:   Code Status: Full Code   Consults: none  Family Communication: none  Severity of Illness: The appropriate patient status for this patient is OBSERVATION. Observation status is judged to be reasonable and necessary in order to provide the required intensity of service to ensure the patient's safety. The patient's presenting symptoms, physical exam findings, and initial radiographic and laboratory data in the context of their medical condition is felt to place them at decreased risk for further clinical deterioration. Furthermore, it is anticipated that  the patient will be medically stable for discharge from the hospital within 2 midnights of admission.   Author: Athena Masse, MD 11/28/2021 9:14 PM  For on call review [email or web address].

## 2021-11-28 NOTE — ED Triage Notes (Signed)
Pt to ED via POV with c/o hypertension. His PMD has been increasing his Losartin to 160mg  and he continues to have his B/P increase. They changed his propanolol to XL last year and has been having problems ever since. He does have a slight headache

## 2021-11-28 NOTE — Assessment & Plan Note (Signed)
Continue levothyroxine 

## 2021-11-28 NOTE — ED Notes (Signed)
Patient given permission to eat per MD

## 2021-11-28 NOTE — ED Provider Notes (Signed)
[facility] Provider Note    Event Date/Time   First MD Initiated Contact with Patient 11/28/21 1729     (approximate)   History   Hypertension   HPI  Kyle Chandler is a 76 y.o. male with a history of hypertension, hypothyroidism, and essential tremor who presents with persistent elevated blood pressure despite increasing his dose of valsartan recently.  The patient has noted a intermittent headache in the back of his head over the last several days which he associates with an elevated blood pressure.  The blood pressure at home was measured to the 180s over around 100 several times today at home.  He denies associated chest pain, difficulty breathing, confusion or lightheadedness, vision changes, or other acute symptoms.  He states that his valsartan was increased from 80 mg to 160 mg a few weeks ago but the blood pressure continues to go up.    Physical Exam   Triage Vital Signs: ED Triage Vitals  Enc Vitals Group     BP 11/28/21 1724 (!) 240/106     Pulse Rate 11/28/21 1724 (!) 57     Resp 11/28/21 1724 20     Temp 11/28/21 1724 98.4 F (36.9 C)     Temp Source 11/28/21 1724 Oral     SpO2 11/28/21 1724 98 %     Weight 11/28/21 1725 166 lb (75.3 kg)     Height 11/28/21 1725 5\' 9"  (1.753 m)     Head Circumference --      Peak Flow --      Pain Score 11/28/21 1724 1     Pain Loc --      Pain Edu? --      Excl. in GC? --     Most recent vital signs: Vitals:   11/28/21 2130 11/28/21 2145  BP: (!) 157/81 (!) 151/69  Pulse: 65 66  Resp: 17 15  Temp:    SpO2: 95% 95%     General: Awake, no distress. CV:  Good peripheral perfusion. Resp:  Normal effort.  Abd:  No distention.  Other:  Motor and intact in all extremities.  No pronator drift.  Normal coordination.  EOMI.  PERRLA.   ED Results / Procedures / Treatments   Labs (all labs ordered are listed, but only abnormal results are displayed) Labs Reviewed  BASIC METABOLIC PANEL -  Abnormal; Notable for the following components:      Result Value   Sodium 130 (*)    Chloride 95 (*)    All other components within normal limits  CBC WITH DIFFERENTIAL/PLATELET - Abnormal; Notable for the following components:   Platelets 32 (*)    All other components within normal limits  RESP PANEL BY RT-PCR (FLU A&B, COVID) [id number]  THYROID PANEL WITH TSH     EKG  ED ECG REPORT I, 2146, the attending physician, personally viewed and interpreted this ECG.  Date: 11/28/2021 EKG Time: 1803 Rate: 58 Rhythm: normal sinus rhythm QRS Axis: normal Intervals: Prolonged PR ST/T Wave abnormalities: normal Narrative Interpretation: no evidence of acute ischemia    RADIOLOGY  CT head: I independently viewed and interpreted the images; there is no evidence of ICH  PROCEDURES:  Critical Care performed: No  Procedures   MEDICATIONS ORDERED IN ED: Medications  propranolol ER (INDERAL LA) 24 hr capsule 80 mg (80 mg Oral Given 11/28/21 2018)  amLODipine (NORVASC) tablet 5 mg (5 mg Oral Given 11/28/21 2128)  acetaminophen (TYLENOL) tablet 650  mg (has no administration in time range)    Or  acetaminophen (TYLENOL) suppository 650 mg (has no administration in time range)  ondansetron (ZOFRAN) tablet 4 mg (has no administration in time range)    Or  ondansetron (ZOFRAN) injection 4 mg (has no administration in time range)  hydrALAZINE (APRESOLINE) injection 5 mg (5 mg Intravenous Given 11/28/21 1836)  hydrALAZINE (APRESOLINE) injection 10 mg (10 mg Intravenous Given 11/28/21 2018)     IMPRESSION / MDM / ASSESSMENT AND PLAN / ED COURSE  I reviewed the triage vital signs and the nursing notes.  76 year old male with PMH as noted above including a history of hypertension on valsartan and propranolol presents with persistently elevated blood pressures at home despite recently increasing the dose of his medication.  His only focal symptom is an intermittent headache that  he has had for several days, and he has had similar headaches previously.  On exam the patient is well-appearing.  He has a stable essential tremor.  Physical exam is otherwise unremarkable.  Neuro exam is normal.  However, he is significantly hypertensive, with one blood pressure reading of 240/106 here, and then around 220/95 when I examined him.    Overall presentation is consistent with essential hypertension.  I have a low suspicion for hypertensive urgency or any endorgan dysfunction based on the lack of other significant symptoms.  There is no evidence of ICH, hypertensive encephalopathy, or other CNS etiology.  We will obtain lab work-up to screen for AKI and obtain an EKG.  I will give a dose of IV hydralazine and reassess the blood pressure.  The patient is on the cardiac monitor to evaluate for evidence of arrhythmia and/or significant heart rate changes.  ----------------------------------------- 9:07 PM on 11/28/2021 -----------------------------------------  Lab on the lab work-up, the creatinine is normal, however the patient's platelet count is low.  I reviewed past labs, and it appears that the patient's baseline platelet count is in the 70s, so this is a significant drop.  The patient is not aware of any cause for his thrombocytopenia that has been determined previously.  He is not having evidence of active bleeding.  Given the presence of headache I obtained a CT to rule out ICH.  This is negative.  The patient's blood pressure initially improved after hydralazine but then increased.  Given the labile blood pressure and the newly worsened thrombocytopenia, I recommended admission and the patient agreed.  I consulted Dr. Para March from the hospitalist service; based on our discussion she agrees to admit the patient.  FINAL CLINICAL IMPRESSION(S) / ED DIAGNOSES   Final diagnoses:  Thrombocytopenia (HCC)  Hypertensive urgency     Rx / DC Orders   ED Discharge Orders      None        Note:  This document was prepared using Dragon voice recognition software and may include unintentional dictation errors.    Dionne Bucy, MD 11/28/21 2251

## 2021-11-28 NOTE — Assessment & Plan Note (Addendum)
Platelet count of 32,000, down from baseline 80,000 (09/2021 on Care everywhere) SCD for DVT prophylaxis No reports of bleeding Continue to trend Consider hematology consult versus outpatient referral

## 2021-11-28 NOTE — ED Notes (Signed)
Report received from Ariel, RN 

## 2021-11-28 NOTE — Assessment & Plan Note (Signed)
Delirium precautions 

## 2021-11-28 NOTE — Assessment & Plan Note (Signed)
Sodium of 130, likely secondary to thiazide diuretic indapamide Will hold indapamide for now and monitor serum sodium

## 2021-11-28 NOTE — Assessment & Plan Note (Signed)
-  Continue propranolol 

## 2021-11-28 NOTE — Assessment & Plan Note (Addendum)
Continue home valsartan 160 mg daily We will add amlodipine 5 mg daily, first dose now Hold off on indapamide due to hyponatremia Continue propranolol for essential tremor Monitor for hypotension in view of past history of a hypotensive event leading to a near syncopal episode

## 2021-11-28 NOTE — ED Notes (Signed)
Waiting for RN to be assigned to pt's room. Updated pt and his wife with verbal understanding

## 2021-11-29 ENCOUNTER — Encounter: Payer: Self-pay | Admitting: Internal Medicine

## 2021-11-29 DIAGNOSIS — Z8679 Personal history of other diseases of the circulatory system: Secondary | ICD-10-CM

## 2021-11-29 DIAGNOSIS — Z87442 Personal history of urinary calculi: Secondary | ICD-10-CM

## 2021-11-29 DIAGNOSIS — I160 Hypertensive urgency: Secondary | ICD-10-CM | POA: Diagnosis not present

## 2021-11-29 LAB — BASIC METABOLIC PANEL
Anion gap: 9 (ref 5–15)
BUN: 15 mg/dL (ref 8–23)
CO2: 27 mmol/L (ref 22–32)
Calcium: 9.4 mg/dL (ref 8.9–10.3)
Chloride: 96 mmol/L — ABNORMAL LOW (ref 98–111)
Creatinine, Ser: 0.8 mg/dL (ref 0.61–1.24)
GFR, Estimated: >60 mL/min (ref >60)
Glucose, Bld: 101 mg/dL — ABNORMAL HIGH (ref 70–99)
Potassium: 3 mmol/L — ABNORMAL LOW (ref 3.5–5.1)
Sodium: 132 mmol/L — ABNORMAL LOW (ref 135–145)

## 2021-11-29 LAB — CBC
HCT: 39 % (ref 39.0–52.0)
Hemoglobin: 13.7 g/dL (ref 13.0–17.0)
MCH: 29.8 pg (ref 26.0–34.0)
MCHC: 35.1 g/dL (ref 30.0–36.0)
MCV: 85 fL (ref 80.0–100.0)
Platelets: 64 10*3/uL — ABNORMAL LOW (ref 150–400)
RBC: 4.59 MIL/uL (ref 4.22–5.81)
RDW: 14 % (ref 11.5–15.5)
WBC: 6.4 10*3/uL (ref 4.0–10.5)
nRBC: 0 % (ref 0.0–0.2)

## 2021-11-29 MED ORDER — AMLODIPINE BESYLATE 10 MG PO TABS
10.0000 mg | ORAL_TABLET | Freq: Every day | ORAL | Status: DC
  Administered 2021-11-29: 10 mg via ORAL
  Filled 2021-11-29: qty 1

## 2021-11-29 MED ORDER — HYDRALAZINE HCL 25 MG PO TABS: 25.0000 mg | ORAL_TABLET | Freq: Two times a day (BID) | ORAL | 2 refills | Status: AC

## 2021-11-29 MED ORDER — PROPRANOLOL HCL ER 80 MG PO CP24
80.0000 mg | ORAL_CAPSULE | Freq: Every day | ORAL | Status: DC
  Filled 2021-11-29: qty 1

## 2021-11-29 MED ORDER — POTASSIUM CHLORIDE CRYS ER 20 MEQ PO TBCR
40.0000 meq | EXTENDED_RELEASE_TABLET | Freq: Once | ORAL | Status: AC
  Administered 2021-11-29: 40 meq via ORAL
  Filled 2021-11-29: qty 2

## 2021-11-29 MED ORDER — AMLODIPINE BESYLATE 10 MG PO TABS: 10.0000 mg | ORAL_TABLET | Freq: Every day | ORAL | 2 refills | Status: AC

## 2021-11-29 MED ORDER — LEVOTHYROXINE SODIUM 100 MCG PO TABS
100.0000 ug | ORAL_TABLET | Freq: Every day | ORAL | Status: DC
  Administered 2021-11-29: 100 ug via ORAL
  Filled 2021-11-29: qty 1

## 2021-11-29 MED ORDER — HYDRALAZINE HCL 25 MG PO TABS
25.0000 mg | ORAL_TABLET | Freq: Three times a day (TID) | ORAL | Status: DC
  Administered 2021-11-29: 25 mg via ORAL
  Filled 2021-11-29: qty 1

## 2021-11-29 MED ORDER — AMLODIPINE BESYLATE 10 MG PO TABS: 10.0000 mg | ORAL_TABLET | Freq: Every day | ORAL | Status: DC

## 2021-11-29 MED ORDER — IRBESARTAN 150 MG PO TABS
150.0000 mg | ORAL_TABLET | Freq: Every day | ORAL | Status: DC
  Administered 2021-11-29: 150 mg via ORAL
  Filled 2021-11-29: qty 1

## 2021-11-29 NOTE — Discharge Summary (Signed)
Physician Discharge Summary   Kyle Chandler  male DOB: 12-Jun-1946  [id number]  PCP: Idelle Crouch, MD  Admit date: 11/28/2021 Discharge date: 11/29/2021  Admitted From: home Disposition:  home Wife updated at bedside prior to discharge.  CODE STATUS: Full code  Discharge Instructions     Discharge instructions   Complete by: As directed    Your blood pressure was >200's on presentation while on your home blood pressure regimen.  We have added amlodipine 10 mg daily and hydralazine 25 mg twice daily for blood pressure control.  Please check your blood pressure 3 times per day, and if top number <110's, then can skip that dose of hydralazine.  Please keep a record of your blood pressure and show it to your PCP when you follow up in about a week after discharge.  Your mildly low sodium level and low platelet are chronic problems are not worse currently.  Please follow up with PCP.   Dr. Enzo Bi [facility] Course:  For full details, please see H&P, progress notes, consult notes and ancillary notes.  Briefly,  Kyle Chandler is a 76 y.o. male with medical history significant for hypertension, hypothyroidism, and essential tremor, who presented to the ED with a complaint of intermittent headache to the back of his head.  He denies aggravating or alleviating factors but states that his blood pressure has been running very high over the last several days with systolics in the A999333 and appears to continue to increase despite PCP-guided increase in his valsartan dose over the course of the last 3 weeks.  Patient states couple years prior he had an episode of severe hypotension with a near passing out episode while on a trip and had been maintained on low doses of antihypertensives since with good control until 3 weeks ago.  On arrival BP 240/106 with pulse of 57.  * Hypertensive urgency- (present on admission) Pt was on home valsartan 160 mg daily, indapamide (for  kidney stones) and propranolol at home which were continued. --added amlodipine 10 mg daily, and hydralazine 25 mg BID. --Pt's BP was 114/58 prior to discharge on this regimen.   --Pt was advised to check his BP 3 times per day to monitor for hypotension, and follow up with PCP for further adjustment.   Chronic Thrombocytopenia (HCC) Platelet count 32 on presentation, and went up to 64 the next day.  Plt low-70's at baseline.   --outpatient f/u with PCP to consider hematology referral   Chronic Hyponatremia, mild Na in low 130's at baseline, likely secondary to thiazide diuretic indapamide.  However, pt has been taking indapamide for a long time for his kidney stone, and Na level has been stable, so will continue indapamide.   History of hypotension Only 1 episode.  Pt was advised to check BP 3 times per day to monitor for hypotension with added BP meds.   History of kidney stones Patient states he takes indapamide for his history of kidney stones and will rather not have it discontinued   Hypothyroidism Continue levothyroxine   Essential tremor- (present on admission) Continue propranolol   Discharge Diagnoses:  Principal Problem:   Hypertensive urgency Active Problems:   Thrombocytopenia (HCC)   Hyponatremia   Essential tremor   Hypothyroidism   Mild cognitive impairment   History of kidney stones   History of hypotension     Discharge Instructions:  Allergies as of 11/29/2021  Reactions   Meperidine Anaphylaxis   Other Anaphylaxis   IVP dye   Meperidine Hcl    Amoxicillin Other (See Comments), Rash   Delayed sensitivity         Medication List     TAKE these medications    amLODipine 10 MG tablet Commonly known as: NORVASC Take 1 tablet (10 mg total) by mouth daily. Start taking on: November 30, 2021   hydrALAZINE 25 MG tablet Commonly known as: APRESOLINE Take 1 tablet (25 mg total) by mouth 2 (two) times daily.   indapamide 2.5 MG  tablet Commonly known as: LOZOL Take 2.5 mg by mouth daily.   levothyroxine 100 MCG tablet Commonly known as: SYNTHROID Take 100 mcg by mouth daily. Take on an empty stomach with a glass of water at least 30 to 60 minutes before breakfast.   propranolol 80 MG 24 hr capsule Commonly known as: INNOPRAN XL Take 80 mg by mouth at bedtime.   sildenafil 25 MG tablet Commonly known as: VIAGRA Take 25 mg by mouth daily as needed for erectile dysfunction.   valsartan 160 MG tablet Commonly known as: DIOVAN Take 160 mg by mouth daily.         Follow-up Information     Idelle Crouch, MD Follow up in 1 week(s).   Specialty: Internal Medicine Contact information: [place] [address] [phone]                 Allergies  Allergen Reactions   Meperidine Anaphylaxis   Other Anaphylaxis    IVP dye   Meperidine Hcl    Amoxicillin Other (See Comments) and Rash    Delayed sensitivity      The results of significant diagnostics from this hospitalization (including imaging, microbiology, ancillary and laboratory) are listed below for reference.   Consultations:   Procedures/Studies: CT Head Wo Contrast  Result Date: 11/28/2021 CLINICAL DATA:  Headache, new or worsening.  Hypertension. EXAM: CT HEAD WITHOUT CONTRAST TECHNIQUE: Contiguous axial images were obtained from the base of the skull through the vertex without intravenous contrast. RADIATION DOSE REDUCTION: This exam was performed according to the departmental dose-optimization program which includes automated exposure control, adjustment of the mA and/or kV according to patient size and/or use of iterative reconstruction technique. COMPARISON:  MRI 04/08/2021 FINDINGS: Brain: The brain shows a normal appearance without evidence of malformation, atrophy, old or acute small or large vessel infarction, mass lesion, hemorrhage, hydrocephalus or extra-axial collection.  Vascular: No hyperdense vessel. No evidence of atherosclerotic calcification. Skull: Normal.  No traumatic finding.  No focal bone lesion. Sinuses/Orbits: Sinuses are clear. Orbits appear normal. Mastoids are clear. Other: None significant IMPRESSION: Normal head CT. Electronically Signed   By: Nelson Chimes M.D.   On: 11/28/2021 20:07      Labs: BNP (last 3 results) No results for input(s): BNP in the last 8760 hours. Basic Metabolic Panel: Recent Labs  Lab 11/28/21 1804 11/29/21 0407  NA 130* 132*  K 3.9 3.0*  CL 95* 96*  CO2 23 27  GLUCOSE 96 101*  BUN 16 15  CREATININE 0.78 0.80  CALCIUM 9.5 9.4   Liver Function Tests: No results for input(s): AST, ALT, ALKPHOS, BILITOT, PROT, ALBUMIN in the last 168 hours. No results for input(s): LIPASE, AMYLASE in the last 168 hours. No results for input(s): AMMONIA in the last 168 hours. CBC: Recent Labs  Lab 11/28/21 1804 11/29/21 0407  WBC 5.7 6.4  NEUTROABS 2.4  --   HGB 13.8 13.7  HCT 40.5 39.0  MCV 86.9 85.0  PLT 32* 64*   Cardiac Enzymes: No results for input(s): CKTOTAL, CKMB, CKMBINDEX, TROPONINI in the last 168 hours. BNP: Invalid input(s): POCBNP CBG: No results for input(s): GLUCAP in the last 168 hours. D-Dimer No results for input(s): DDIMER in the last 72 hours. Hgb A1c No results for input(s): HGBA1C in the last 72 hours. Lipid Profile No results for input(s): CHOL, HDL, LDLCALC, TRIG, CHOLHDL, LDLDIRECT in the last 72 hours. Thyroid function studies No results for input(s): TSH, T4TOTAL, T3FREE, THYROIDAB in the last 72 hours.  Invalid input(s): FREET3 Anemia work up No results for input(s): VITAMINB12, FOLATE, FERRITIN, TIBC, IRON, RETICCTPCT in the last 72 hours. Urinalysis No results found for: COLORURINE, APPEARANCEUR, [place], [place], GLUCOSEU, [place], [place], [place], PROTEINUR, UROBILINOGEN, NITRITE, LEUKOCYTESUR Sepsis Labs Invalid input(s): PROCALCITONIN,  WBC,   LACTICIDVEN Microbiology Recent Results (from the past 240 hour(s))  Resp Panel by RT-PCR (Flu A&B, Covid) Nasopharyngeal Swab     Status: None   Collection Time: 11/28/21  9:21 PM   Specimen: Nasopharyngeal Swab; Nasopharyngeal(NP) swabs in vial transport medium  Result Value Ref Range Status   SARS Coronavirus 2 by RT PCR NEGATIVE NEGATIVE Final    Comment: (NOTE) SARS-CoV-2 target nucleic acids are NOT DETECTED.  The SARS-CoV-2 RNA is generally detectable in upper respiratory specimens during the acute phase of infection. The lowest concentration of SARS-CoV-2 viral copies this assay can detect is 138 copies/mL. A negative result does not preclude SARS-Cov-2 infection and should not be used as the sole basis for treatment or other patient management decisions. A negative result may occur with  improper specimen collection/handling, submission of specimen other than nasopharyngeal swab, presence of viral mutation(s) within the areas targeted by this assay, and inadequate number of viral copies(<138 copies/mL). A negative result must be combined with clinical observations, patient history, and epidemiological information. The expected result is Negative.  Fact Sheet for Patients:  EntrepreneurPulse.com.au  Fact Sheet for Healthcare Providers:  IncredibleEmployment.be  This test is no t yet approved or cleared by the [place] FDA and  has been authorized for detection and/or diagnosis of SARS-CoV-2 by FDA under an Emergency Use Authorization (EUA). This EUA will remain  in effect (meaning this test can be used) for the duration of the COVID-19 declaration under Section 564(b)(1) of the Act, 21 U.S.C.section 360bbb-3(b)(1), unless the authorization is terminated  or revoked sooner.       Influenza A by PCR NEGATIVE NEGATIVE Final   Influenza B by PCR NEGATIVE NEGATIVE Final    Comment: (NOTE) The Xpert Xpress SARS-CoV-2/FLU/RSV plus  assay is intended as an aid in the diagnosis of influenza from Nasopharyngeal swab specimens and should not be used as a sole basis for treatment. Nasal washings and aspirates are unacceptable for Xpert Xpress SARS-CoV-2/FLU/RSV testing.  Fact Sheet for Patients: EntrepreneurPulse.com.au  Fact Sheet for Healthcare Providers: IncredibleEmployment.be  This test is not yet approved or cleared by the [place] FDA and has been authorized for detection and/or diagnosis of SARS-CoV-2 by FDA under an Emergency Use Authorization (EUA). This EUA will remain in effect (meaning this test can be used) for the duration of the COVID-19 declaration under Section 564(b)(1) of the Act, 21 U.S.C. section 360bbb-3(b)(1), unless the authorization is terminated or revoked.  Performed at [facility], [address] [place], [place] [address]      Total time spend on discharging  this patient, including the last patient exam, discussing the hospital stay, instructions for ongoing care as it relates to all pertinent caregivers, as well as preparing the medical discharge records, prescriptions, and/or referrals as applicable, is 50 minutes.    Enzo Bi, MD  [facility] 11/29/2021, 4:08 PM

## 2021-11-29 NOTE — Progress Notes (Signed)
Discharge instructions explained to pt/ verbalized an understanding/ iv and tele removed/ will transport off unit via wheelchair.  

## 2021-11-29 NOTE — Assessment & Plan Note (Signed)
History of hypotensive episode now with severe hypertension We will monitor orthostatics Consider cardiology referral at discharge

## 2021-11-29 NOTE — Assessment & Plan Note (Signed)
Patient states he takes indapamide for his history of kidney stones and will rather not have it discontinued

## 2021-11-29 NOTE — Progress Notes (Signed)
°  Transition of Care [facility]) Screening Note   Patient Details  Name: Kyle Chandler Date of Birth: 03-27-1946   Transition of Care [facility]) CM/SW Contact:    Alberteen Sam, LCSW Phone Number: 11/29/2021, 8:32 AM    Transition of Care Department [facility]) has reviewed patient and no TOC needs have been identified at this time. We will continue to monitor patient advancement through interdisciplinary progression rounds. If new patient transition needs arise, please place a TOC consult.  [place], [place]

## 2021-11-30 LAB — THYROID PANEL WITH TSH
Free Thyroxine Index: 1.6 (ref 1.2–4.9)
T3 Uptake Ratio: 29 % (ref 24–39)
T4, Total: 5.4 ug/dL (ref 4.5–12.0)
TSH: 2.49 u[IU]/mL (ref 0.450–4.500)

## 2022-01-06 ENCOUNTER — Other Ambulatory Visit (HOSPITAL_COMMUNITY): Payer: Self-pay | Admitting: Internal Medicine

## 2022-01-06 ENCOUNTER — Other Ambulatory Visit: Payer: Self-pay | Admitting: Internal Medicine

## 2022-01-06 DIAGNOSIS — I10 Essential (primary) hypertension: Secondary | ICD-10-CM

## 2022-01-17 ENCOUNTER — Other Ambulatory Visit: Payer: Self-pay | Admitting: Internal Medicine

## 2022-01-17 DIAGNOSIS — I701 Atherosclerosis of renal artery: Secondary | ICD-10-CM

## 2022-01-17 DIAGNOSIS — D696 Thrombocytopenia, unspecified: Secondary | ICD-10-CM

## 2022-01-17 DIAGNOSIS — I10 Essential (primary) hypertension: Secondary | ICD-10-CM

## 2022-01-28 ENCOUNTER — Ambulatory Visit
Admission: RE | Admit: 2022-01-28 | Discharge: 2022-01-28 | Disposition: A | Discharge status: Home / Self Care | Payer: Medicare Other | Source: Ambulatory Visit | Attending: Internal Medicine | Admitting: Internal Medicine

## 2022-01-28 DIAGNOSIS — I10 Essential (primary) hypertension: Secondary | ICD-10-CM | POA: Diagnosis present

## 2022-01-28 DIAGNOSIS — N200 Calculus of kidney: Secondary | ICD-10-CM | POA: Diagnosis not present

## 2022-01-28 IMAGING — US US RENAL ARTERY STENOSIS
1 series · 13 of 25 positions shown · non-contrast
Comparison: None.

CLINICAL DATA: Malignant hypertension; HTN goal below 140/80

EXAM:
RENAL/URINARY TRACT ULTRASOUND
RENAL DUPLEX DOPPLER ULTRASOUND

[Series 1: us renal artery duplex complete · 13 of 87 slices shown]
[im 1/87]
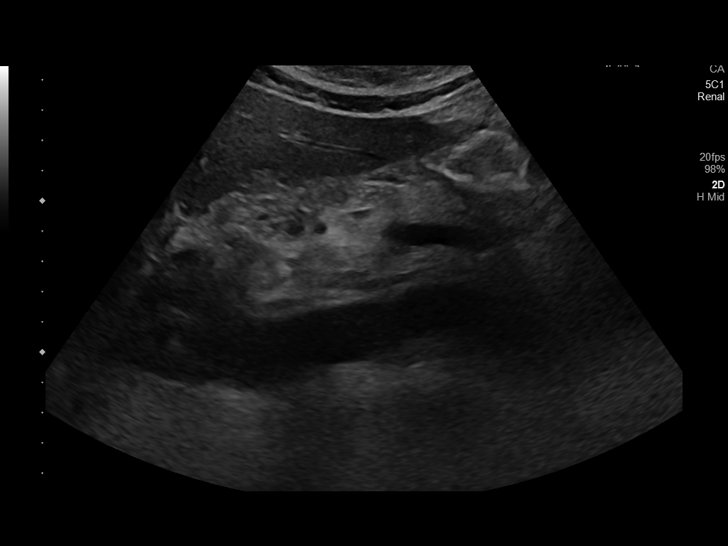
[im 8/87]
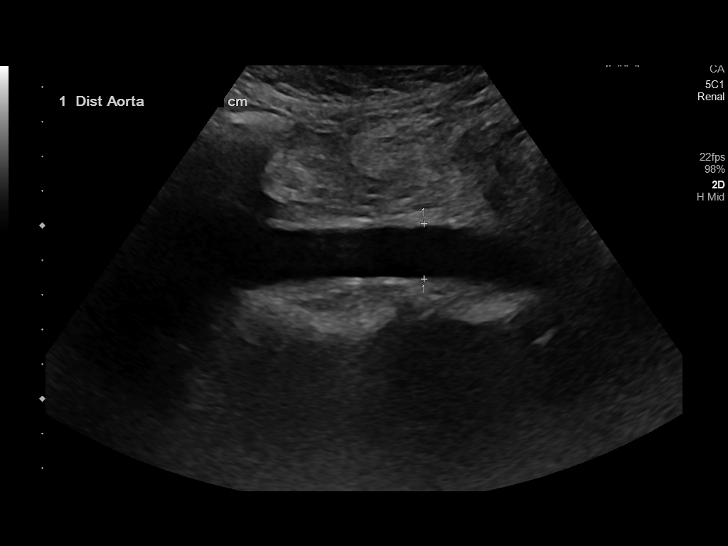
[im 15/87]
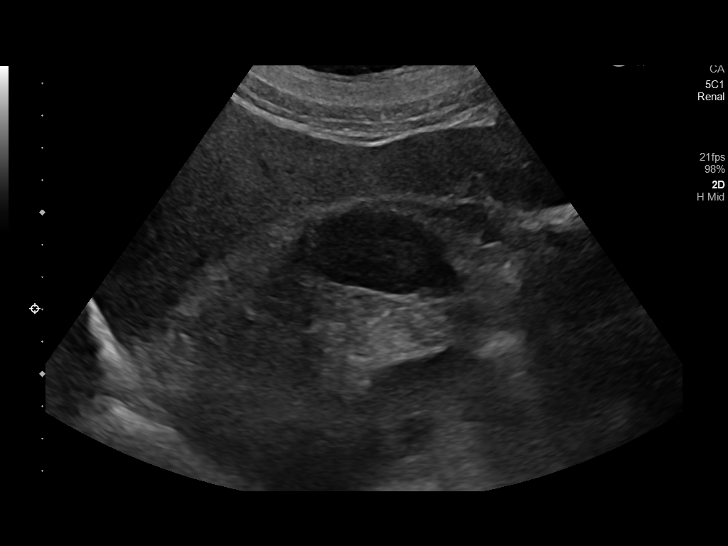
[im 22/87]
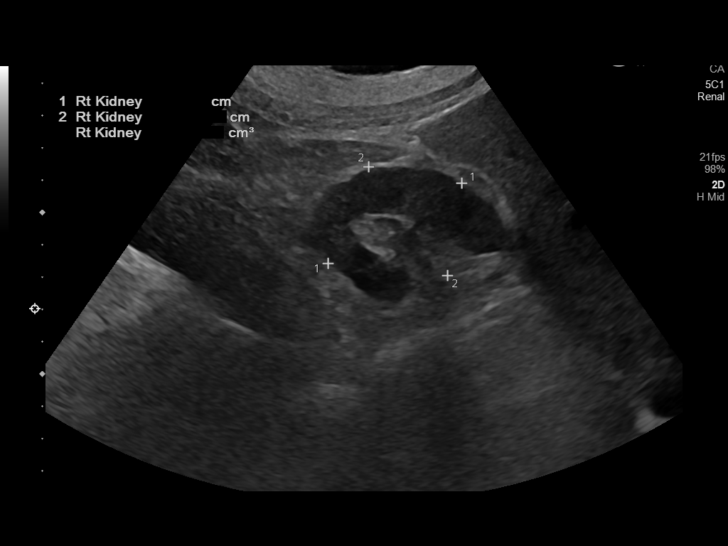
[im 29/87]
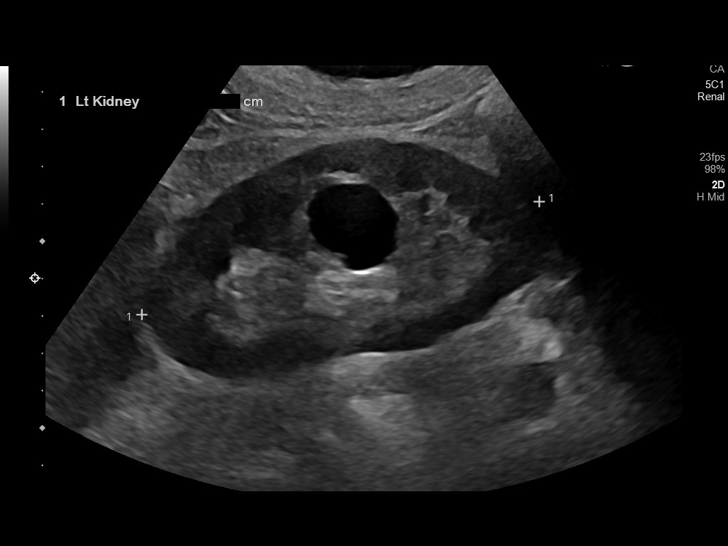
[im 36/87]
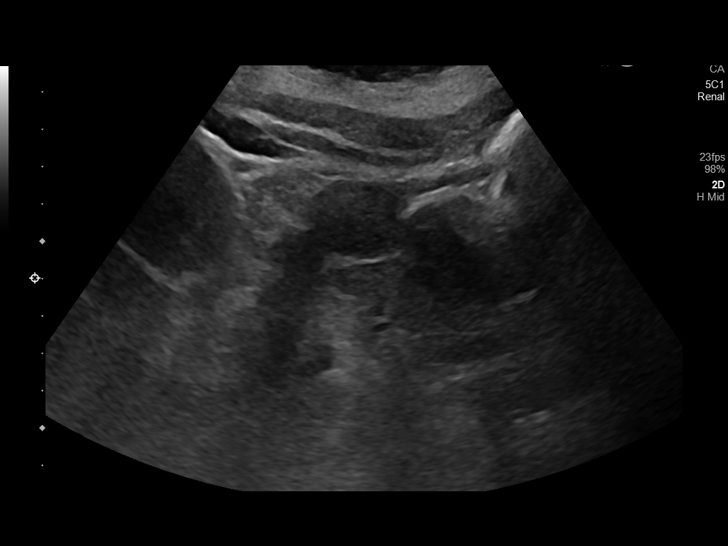
[im 44/87]
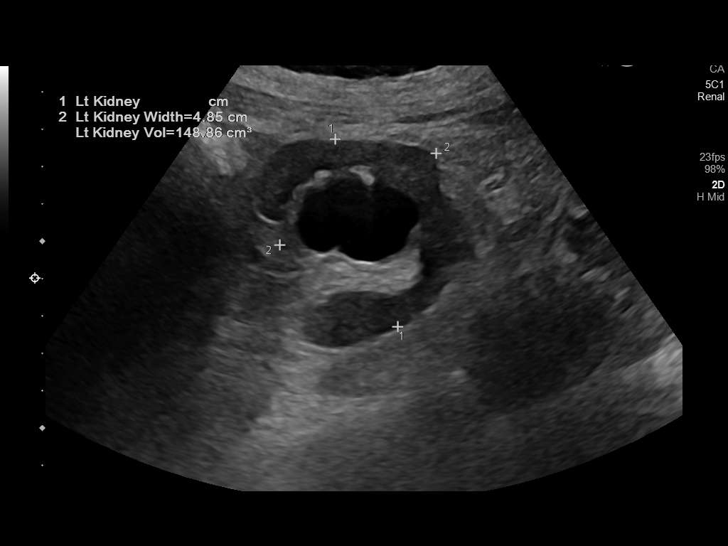
[im 51/87]
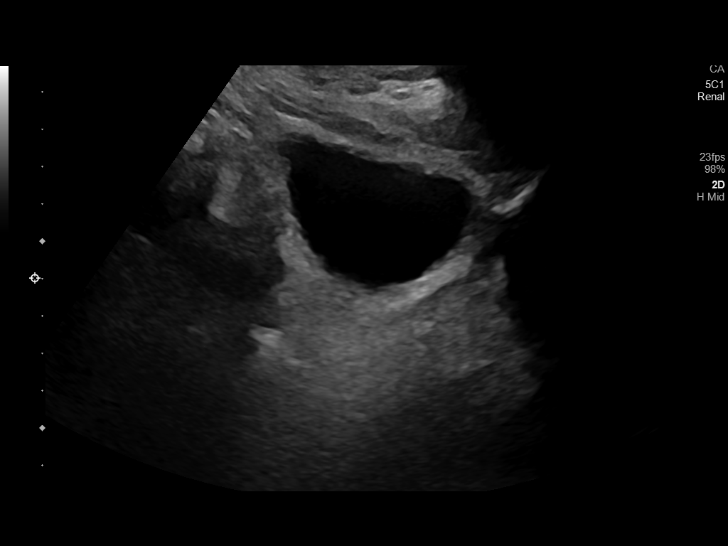
[im 58/87]
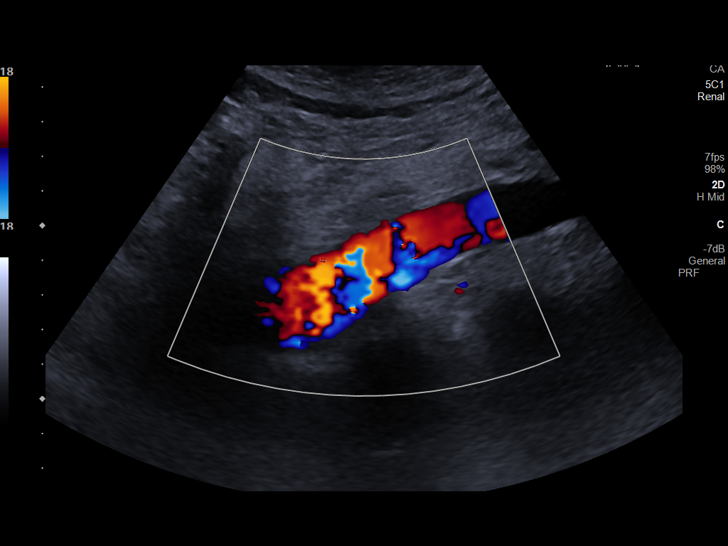
[im 65/87]
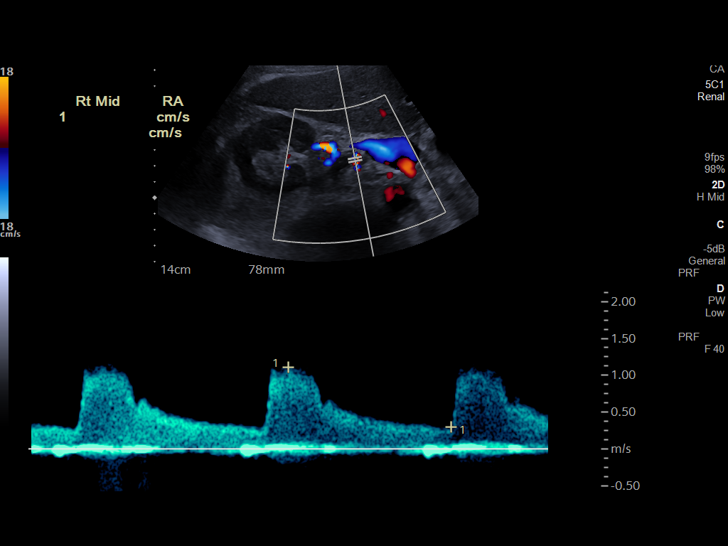
[im 72/87]
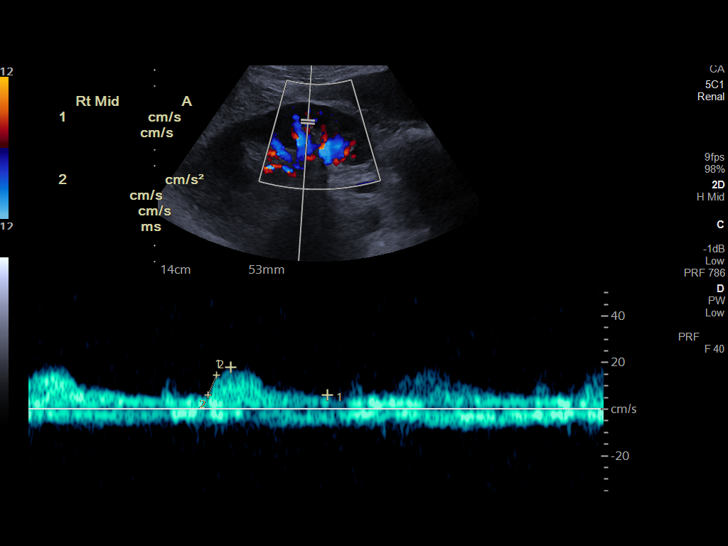
[im 79/87]
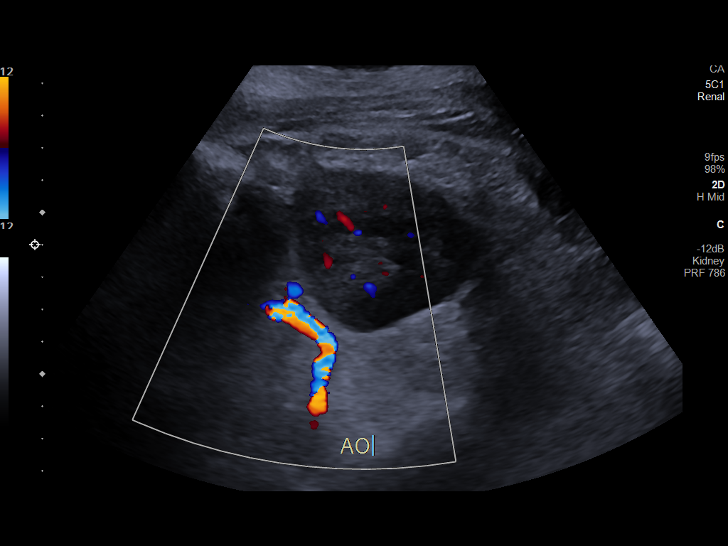
[im 87/87]
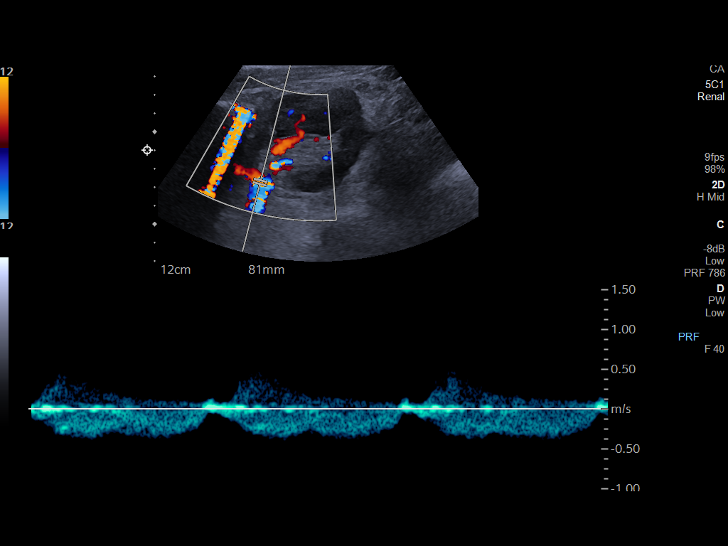

[13 of 25 positions shown; findings below may reference images not displayed]

FINDINGS: Right Kidney:

Length: 11.4 cm. Echogenicity within normal limits. No mass or
hydronephrosis visualized.

Left Kidney:

Length: 11.1 cm. No hydronephrosis. There is an anechoic simple
renal cyst in the midpole measuring up to approximately 3 cm in
size. Nonobstructive nephrolithiasis.

Bladder:  Within normal limits for degree of distention.

RENAL DUPLEX ULTRASOUND

Right Renal Artery Velocities:

Origin:  110 cm/sec

Mid:  130 cm/sec

Hilum:  166 cm/sec

Interlobar:  58 cm/sec

Arcuate:  39 cm/sec; normal resistive indices.

Left Renal Artery Velocities:

Origin:  112 cm/sec

Mid:  96 cm/sec

Hilum:  81 cm/sec

Interlobar:  46 cm/sec

Arcuate:  53 cm/sec; normal resistive indices.

Aortic Velocity:  92 cm/sec

Right Renal-Aortic Ratios:

Origin:

Mid:

Hilum:

Interlobar:

Arcuate:

Left Renal-Aortic Ratios:

Origin:

Mid:

Hilum:

Interlobar:

Arcuate:

Bilateral renal veins are patent.
IMPRESSION: 1. No findings of renal artery stenosis by Doppler criteria.
2. Left nonobstructive nephrolithiasis.

## 2022-04-19 ENCOUNTER — Ambulatory Visit
Admission: EM | Admit: 2022-04-19 | Discharge: 2022-04-19 | Disposition: A | Discharge status: Home / Self Care | Payer: Medicare Other | Attending: Emergency Medicine | Admitting: Emergency Medicine

## 2022-04-19 DIAGNOSIS — I10 Essential (primary) hypertension: Secondary | ICD-10-CM | POA: Diagnosis not present

## 2022-04-19 DIAGNOSIS — H6123 Impacted cerumen, bilateral: Secondary | ICD-10-CM

## 2022-04-19 NOTE — Discharge Instructions (Addendum)
The earwax was removed today.  Follow up with your primary care provider if your symptoms as needed.    Your blood pressure is elevated today at 167/90.  Please have this rechecked by your primary care provider in 2-4 weeks.

## 2022-04-19 NOTE — ED Provider Notes (Signed)
Kyle Chandler    CSN: [id number] Arrival date & time: 04/19/22  7829      History   Chief Complaint Chief Complaint  Patient presents with   Ear Fullness    HPI Kyle Chandler is a 76 y.o. male.  Patient presents with right ear fullness due to earwax buildup x11 days.  He attempted removal with a Q-tip and then OTC earwax removal kit.  He denies fever, ear pain, sore throat, cough, shortness of breath, vomiting, diarrhea, or other symptoms.  His medical history includes hypertension and hypertensive urgency.  The history is provided by the patient and medical records.    Past Medical History:  Diagnosis Date   Hypertension    Hypothyroidism    Renal calculi     Patient Active Problem List   Diagnosis Date Noted   History of kidney stones 11/29/2021   History of hypotension 11/29/2021   Hypertensive urgency 11/28/2021   Thrombocytopenia ([place]) 11/28/2021   Hyponatremia 11/28/2021   Hypothyroidism 11/28/2021   Mild cognitive impairment 11/28/2021   Essential tremor 01/06/2020    Past Surgical History:  Procedure Laterality Date   VASECTOMY         Home Medications    Prior to Admission medications   Medication Sig Start Date End Date Taking? Authorizing Provider  amLODipine (NORVASC) 10 MG tablet Take 1 tablet (10 mg total) by mouth daily. 11/30/21 02/28/22  Enzo Bi, MD  hydrALAZINE (APRESOLINE) 25 MG tablet Take 1 tablet (25 mg total) by mouth 2 (two) times daily. 11/29/21 02/27/22  Enzo Bi, MD  indapamide (LOZOL) 2.5 MG tablet Take 2.5 mg by mouth daily. 02/09/21   [provider]  levothyroxine (SYNTHROID) 100 MCG tablet Take 100 mcg by mouth daily. Take on an empty stomach with a glass of water at least 30 to 60 minutes before breakfast. 01/06/21   [provider]  propranolol (INNOPRAN XL) 80 MG 24 hr capsule Take 80 mg by mouth at bedtime. 09/06/21 09/06/22  [provider]  sildenafil (VIAGRA) 25 MG tablet Take 25 mg by  mouth daily as needed for erectile dysfunction. 01/06/21   [provider]  valsartan (DIOVAN) 160 MG tablet Take 160 mg by mouth daily. 11/24/21   [provider]    Family History History reviewed. No pertinent family history.  Social History Social History   Tobacco Use   Smoking status: Never   Smokeless tobacco: Never  Substance Use Topics   Alcohol use: Yes    Alcohol/week: 2.0 standard drinks of alcohol    Types: 2 Shots of liquor per week    Comment: 2 ounces per night for tremors   Drug use: Never     Allergies   Meperidine, Other, Meperidine hcl, and Amoxicillin   Review of Systems Review of Systems  Constitutional:  Negative for chills and fever.  HENT:  Negative for ear discharge, ear pain, hearing loss and sore throat.   Respiratory:  Negative for cough and shortness of breath.   Cardiovascular:  Negative for chest pain and palpitations.  All other systems reviewed and are negative.    Physical Exam Triage Vital Signs ED Triage Vitals  Enc Vitals Group     BP      Pulse      Resp      Temp      Temp src      SpO2      Weight      Height  Head Circumference      Peak Flow      Pain Score      Pain Loc      Pain Edu?      Excl. in [place]?    No data found.  Updated Vital Signs BP (!) 167/90 (BP Location: Left Arm)   Pulse 70   Temp 97.9 F (36.6 C)   Resp 18   SpO2 96%   Visual Acuity Right Eye Distance:   Left Eye Distance:   Bilateral Distance:    Right Eye Near:   Left Eye Near:    Bilateral Near:     Physical Exam Vitals and nursing note reviewed.  Constitutional:      General: He is not in acute distress.    Appearance: Normal appearance. He is well-developed. He is not ill-appearing.  HENT:     Right Ear: There is impacted cerumen.     Left Ear: There is impacted cerumen.     Nose: Nose normal.     Mouth/Throat:     Mouth: Mucous membranes are moist.     Pharynx: Oropharynx is clear.   Cardiovascular:     Rate and Rhythm: Normal rate and regular rhythm.     Heart sounds: Normal heart sounds.  Pulmonary:     Effort: Pulmonary effort is normal. No respiratory distress.     Breath sounds: Normal breath sounds.  Musculoskeletal:     Cervical back: Neck supple.  Skin:    General: Skin is warm and dry.  Neurological:     Mental Status: He is alert.  Psychiatric:        Mood and Affect: Mood normal.        Behavior: Behavior normal.      UC Treatments / Results  Labs (all labs ordered are listed, but only abnormal results are displayed) Labs Reviewed - No data to display  EKG   Radiology No results found.  Procedures Procedures (including critical care time)  Medications Ordered in UC Medications - No data to display  Initial Impression / Assessment and Plan / UC Course  I have reviewed the triage vital signs and the nursing notes.  Pertinent labs & imaging results that were available during my care of the patient were reviewed by me and considered in my medical decision making (see chart for details).    Bilateral cerumen impaction.  Elevated blood pressure with hypertension.  Cerumen removed via irrigation by RN and curette by provider.  After cerumen removal, TMs noted to be clear and canals clear.  Patient reports relief of his symptoms.  Instructed him to follow-up with his PCP or ENT as needed.  Also discussed that his blood pressure is elevated today and needs to be rechecked by his PCP in 2 to 4 weeks.  He reports his blood pressure is usually elevated at the doctor's office and he has whitecoat syndrome.  He states it is controlled at home and he is followed by specialist at [facility] for it.  Education provided on managing hypertension.  Instructed patient to have it rechecked in 2 to 4 weeks by his PCP or specialist.  He agrees to plan of care.  Final Clinical Impressions(s) / UC Diagnoses   Final diagnoses:  Bilateral impacted cerumen  Elevated  blood pressure reading in office with diagnosis of hypertension     Discharge Instructions      The earwax was removed today.  Follow up with your primary care provider if  your symptoms as needed.    Your blood pressure is elevated today at 167/90.  Please have this rechecked by your primary care provider in 2-4 weeks.          ED Prescriptions   None    PDMP not reviewed this encounter.   Sharion Balloon, NP 04/19/22 1052

## 2022-04-19 NOTE — ED Triage Notes (Signed)
Patient presents to Urgent Care with complaints of right ear fullness x 11 days. Used OTC ear wash kit with no improvement.

## 2023-07-05 ENCOUNTER — Telehealth: Payer: Self-pay

## 2023-07-05 NOTE — Telephone Encounter (Signed)
This gentleman left a message on the nurse line, asking about an appointment for nail trim - He would be a new patient. Please call to schedule -thanks

## 2023-07-05 NOTE — Telephone Encounter (Signed)
Called pt to get scheduled and the phone just rings no vm
